# Patient Record
Sex: Female | Born: 1995 | Race: Black or African American | Hispanic: No | Marital: Single
Health system: Midwestern US, Community
[De-identification: ages and names within clinical notes are randomized; demographics above are authoritative.]

## PROBLEM LIST (undated history)

## (undated) DIAGNOSIS — Z8742 Personal history of other diseases of the female genital tract: Secondary | ICD-10-CM

## (undated) DIAGNOSIS — D571 Sickle-cell disease without crisis: Secondary | ICD-10-CM

## (undated) DIAGNOSIS — E039 Hypothyroidism, unspecified: Secondary | ICD-10-CM

## (undated) DIAGNOSIS — Z9289 Personal history of other medical treatment: Secondary | ICD-10-CM

## (undated) DIAGNOSIS — E079 Disorder of thyroid, unspecified: Secondary | ICD-10-CM

## (undated) DIAGNOSIS — D573 Sickle-cell trait: Secondary | ICD-10-CM

## (undated) HISTORY — PX: NO PAST SURGERIES: SHX2092

## (undated) HISTORY — DX: Sickle-cell disease without crisis: D57.1

## (undated) HISTORY — PX: DILATION AND CURETTAGE OF UTERUS: SHX78

---

## 2016-10-18 ENCOUNTER — Inpatient Hospital Stay
Admit: 2016-10-18 | Discharge: 2016-10-19 | Disposition: A | Discharge status: Home / Self Care | Payer: MEDICAID | Attending: Emergency Medicine

## 2016-10-18 DIAGNOSIS — Z760 Encounter for issue of repeat prescription: Secondary | ICD-10-CM

## 2016-10-18 LAB — HCG URINE, QL: HCG urine, QL: POSITIVE — AB

## 2016-10-18 LAB — URINALYSIS W/ RFLX MICROSCOPIC
Bilirubin: NEGATIVE
Blood: NEGATIVE
Glucose: NEGATIVE mg/dL
Leukocyte Esterase: NEGATIVE
Nitrites: NEGATIVE
Protein: NEGATIVE mg/dL
Specific gravity: 1.029 (ref 1.005–1.030)
Urobilinogen: 1 EU/dL (ref 0.2–1.0)
pH (UA): 6.5 (ref 5.0–8.0)

## 2016-10-18 NOTE — ED Triage Notes (Signed)
PT states she was spotting, took at home pregnancy test and it was positive. LMP 09/12/16. Patient states shes also out of her thyroid medication and needs a refill

## 2016-10-18 NOTE — ED Provider Notes (Signed)
EMERGENCY DEPARTMENT HISTORY AND PHYSICAL EXAM    7:28 PM      Date: 10/18/2016  Patient Name: Christine Lam    History of Presenting Illness     Chief Complaint   Patient presents with   ??? Pelvic Pain   ??? Medication Refill         History Provided By: Patient     Chief Complaint: vaginal bleeding   Duration:  1 day   Timing: acute   Location: GU  Quality: spotting   Severity: not reported   Modifying Factors: has not taken anything for it   Associated Symptoms: pelvic pain. No fevers, chills, dizziness, nausea, vomiting.       Additional History (Context): Christine Lam is a 21 y.o. female presenting to the ED for the evaluation of pos home pregnancy test. She then took an at home pregnancy test that was positive. Patient's last menstrual period was 09/12/2016. She reports associated pelvic pain but denies this at present No bleeding at present.. No fevers, chills, dizziness, nausea, vomiting. Patient also explains that she needs a refill of her thyroid medication.Denies CP, palpitations. Has apt with pcp next month but trying to be seen next week. No known drug allergies. No other complaints or concerns at this time.     PCP: None        Past History     Past Medical History:  Past Medical History:   Diagnosis Date   ??? Hypothyroid        Past Surgical History:  History reviewed. No pertinent surgical history.    Family History:  History reviewed. No pertinent family history.    Social History:  Social History   Substance Use Topics   ??? Smoking status: Never Smoker   ??? Smokeless tobacco: Never Used   ??? Alcohol use No       Allergies:  No Known Allergies      Review of Systems       Review of Systems   Constitutional: Negative for chills and fever.   Gastrointestinal: Negative for nausea and vomiting.   Genitourinary: Positive for menstrual problem, pelvic pain and vaginal bleeding (spotting).   Neurological: Negative for dizziness.   All other systems reviewed and are negative.        Physical Exam      Visit Vitals   ??? BP 129/80 (BP Patient Position: At rest)   ??? Pulse 78   ??? Temp 98.5 ??F (36.9 ??C)   ??? Resp 16   ??? Ht  (1.803 m)   ??? Wt 72.6 kg (160 lb)   ??? SpO2 100%   ??? BMI 22.32 kg/m2         Physical Exam   Constitutional: She is oriented to person, place, and time. She appears well-developed and well-nourished. No distress.   NAD, well hydrated, non toxic     HENT:   Head: Normocephalic and atraumatic.   Nose: Nose normal.   Mouth/Throat: Oropharynx is clear and moist. No oropharyngeal exudate.   Eyes: Conjunctivae and EOM are normal. Pupils are equal, round, and reactive to light.   Neck: Normal range of motion. Neck supple.   Cardiovascular: Normal rate, regular rhythm and normal heart sounds.    No murmur heard.  Pulmonary/Chest: Effort normal and breath sounds normal. No respiratory distress. She has no wheezes. She has no rales.   Abdominal: Soft. She exhibits no distension. There is no tenderness. There is no guarding.   Musculoskeletal:  Normal range of motion.   Lymphadenopathy:     She has no cervical adenopathy.   Neurological: She is alert and oriented to person, place, and time. No cranial nerve deficit. Coordination normal.   Skin: Skin is warm. No rash noted. She is not diaphoretic.   Psychiatric: She has a normal mood and affect. Her behavior is normal.   Nursing note and vitals reviewed.        Diagnostic Study Results     Labs -  Recent Results (from the past 12 hour(s))   URINALYSIS W/ RFLX MICROSCOPIC    Collection Time: 10/18/16  7:24 PM   Result Value Ref Range    Color YELLOW      Appearance CLEAR      Specific gravity 1.029 1.005 - 1.030      pH (UA) 6.5 5.0 - 8.0      Protein NEGATIVE  NEG mg/dL    Glucose NEGATIVE  NEG mg/dL    Ketone TRACE (A) NEG mg/dL    Bilirubin NEGATIVE  NEG      Blood NEGATIVE  NEG      Urobilinogen 1.0 0.2 - 1.0 EU/dL    Nitrites NEGATIVE  NEG      Leukocyte Esterase NEGATIVE  NEG     HCG URINE, QL    Collection Time: 10/18/16  7:24 PM    Result Value Ref Range    HCG urine, QL POSITIVE (A) NEG         Radiologic Studies -   No orders to display         Medical Decision Making   I am the first provider for this patient.    I reviewed the vital signs, available nursing notes, past medical history, past surgical history, family history and social history.    Vital Signs-Reviewed the patient's vital signs.    Pulse Oximetry Analysis -  100% on RA (Interpretation) wnl    Records Reviewed: Nursing Notes (Time of Review: 7:28 PM)    ED Course: Progress Notes, Reevaluation, and Consults:      Provider Notes (Medical Decision Making):   Pt here with request for pregnancy test. States she took home one and it was positive. Has mild pelvic cramping but denies this at present as well as no bleeding despite nursing notes. No indication for pelvic. No discharge, or bleeding. States all she wanted was test. Also needing synthroid refill. Will f/u with her pcp next week. No further sx or work up needed.       Diagnosis     Clinical Impression:   1. Medication refill    2. Positive pregnancy test        Disposition: Discharge to home     Follow-up Information     Follow up With Details Comments Contact Info    Gregary Signs, DO Schedule an appointment as soon as possible for a visit  [address]  [address]  [place] [address]  [phone]      [facility] EMERGENCY DEPT   [address]  [place] [address]  [phone]           Patient's Medications   Start Taking    LEVOTHYROXINE (SYNTHROID) 50 MCG TABLET    Take 1 Tab by mouth Daily (before breakfast) for 30 days.    PRENATAL MULTIVIT-CA-MIN-FE-FA (PRENATAL VITAMIN) TAB    Take 1 Tab by mouth daily.   Continue Taking    No medications on file  These Medications have changed    No medications on file   Stop Taking    No medications on file     _______________________________    Attestations:  Scribe AttestaWandra Scotld Baldomero acting as a scribe for and in the presence of Garth Schlatter       October 18, 2016 at 7:28 PM       Provider Attestation:      I personally performed the services described in the documentation, reviewed the documentation, as recorded by the scribe in my presence, and it accurately and completely records my words and actions. October 18, 2016 at 7:28 PM - Earl Gala, PA-C  _______________________________

## 2016-10-18 NOTE — ED Notes (Signed)
I have reviewed discharge instructions with the patient.  The patient verbalized understanding.  Patient armband removed and shredded  Pt VSS, NAD, pain reassessed

## 2016-10-19 ENCOUNTER — Inpatient Hospital Stay: Admit: 2016-10-19 | Discharge: 2016-10-19 | Payer: MEDICAID | Attending: Emergency Medicine

## 2016-10-19 ENCOUNTER — Ambulatory Visit: Primary: Maternal & Fetal Medicine

## 2016-10-19 MED ORDER — PRENATAL VITAMIN,CALCIUM,MINERALS-IRON-FOLIC ACID TABLET
ORAL_TABLET | Freq: Every day | ORAL | 0 refills | Status: DC
Start: 2016-10-19 — End: 2017-01-19

## 2016-10-19 MED ORDER — LEVOTHYROXINE 50 MCG TAB
50 mcg | ORAL_TABLET | Freq: Every day | ORAL | 0 refills | Status: AC
Start: 2016-10-19 — End: 2016-11-17
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
## 2017-01-19 ENCOUNTER — Inpatient Hospital Stay
Admit: 2017-01-19 | Discharge: 2017-01-20 | Disposition: A | Discharge status: Home / Self Care | Payer: MEDICAID | Attending: Emergency Medicine

## 2017-01-19 DIAGNOSIS — O98812 Other maternal infectious and parasitic diseases complicating pregnancy, second trimester: Secondary | ICD-10-CM

## 2017-01-19 LAB — URINALYSIS W/ RFLX MICROSCOPIC
Bilirubin: NEGATIVE
Blood: NEGATIVE
Glucose: NEGATIVE mg/dL
Ketone: NEGATIVE mg/dL
Nitrites: NEGATIVE
Protein: NEGATIVE mg/dL
Specific gravity: 1.02 (ref 1.005–1.030)
Urobilinogen: 0.2 EU/dL (ref 0.2–1.0)
pH (UA): 7.5 (ref 5.0–8.0)

## 2017-01-19 LAB — URINE MICROSCOPIC ONLY
Bacteria: NEGATIVE /hpf
RBC: 0 /hpf (ref 0–5)
WBC: 2 /hpf (ref 0–4)

## 2017-01-19 LAB — WET PREP: Wet prep: NONE SEEN

## 2017-01-19 NOTE — ED Provider Notes (Signed)
HPI     22 y/o, [redacted] week pregnant, female with h/o intermittent mild vaginal discharge and discomfort x 1 week. The pt notes she doesn't have her next Ob/gyn f/u appointment until 02/04/2017. The pt denies fever, chills, abdominal pain, CP, SOB, and any other associated sxs.       Past Medical History:   Diagnosis Date   ??? Hypothyroid        No past surgical history on file.      No family history on file.    Social History     Socioeconomic History   ??? Marital status: SINGLE     Spouse name: Not on file   ??? Number of children: Not on file   ??? Years of education: Not on file   ??? Highest education level: Not on file   Social Needs   ??? Financial resource strain: Not on file   ??? Food insecurity - worry: Not on file   ??? Food insecurity - inability: Not on file   ??? Transportation needs - medical: Not on file   ??? Transportation needs - non-medical: Not on file   Occupational History   ??? Not on file   Tobacco Use   ??? Smoking status: Never Smoker   ??? Smokeless tobacco: Never Used   Substance and Sexual Activity   ??? Alcohol use: No   ??? Drug use: No   ??? Sexual activity: Not on file   Other Topics Concern   ??? Not on file   Social History Narrative   ??? Not on file         ALLERGIES: Patient has no known allergies.    Review of Systems   Constitutional: Negative for chills and fever.   Respiratory: Negative for shortness of breath.    Cardiovascular: Negative for chest pain.   Gastrointestinal: Negative for abdominal pain.   Genitourinary: Positive for vaginal discharge and vaginal pain.   All other systems reviewed and are negative.      There were no vitals filed for this visit.         Physical Exam   Constitutional: She appears well-developed and well-nourished. No distress.   HENT:   Head: Normocephalic and atraumatic.   Nose: Nose normal.   Eyes: EOM are normal. Right eye exhibits no discharge. Left eye exhibits no discharge. No scleral icterus.   Neck: Normal range of motion. Neck supple. No tracheal deviation present.    Cardiovascular: Normal rate, regular rhythm and intact distal pulses.   Pulmonary/Chest: Effort normal and breath sounds normal.   Abdominal: Soft. She exhibits no distension.   Genitourinary:   Genitourinary Comments: See pelvic procedure note,.     Musculoskeletal: She exhibits no deformity.   Neurological: She is alert. Coordination normal.   Skin: Skin is warm and dry. NE.   Psychiatric: She has a normal mood and affect. Her behavior is normal.   Nursing note and vitals reviewed.       MDM       Pelvic Exam  Date/Time: 01/19/2017 6:29 PM  Performed by: Eusebio Me NP-C.  Procedure duration (minutes): 2.  Documented by: Eusebio Me NP-C.   Chaperone: Passenger transport manager.  Type of exam performed: speculum.    External genitalia appearance: normal.    Vaginal exam:  discharge.    The amount of discharge was:  mild.  The discharge was white.   Cervical exam:  normal.    Specimen(s) collected:  chlamydia, GC and vaginal  culture.          Scribe Attestation     Luanna SalkBrian E Ochoa acting as a Neurosurgeonscribe for and in the presence of Katherine Roanhur, Kiron Osmun, NP-C      January 19, 2017 at 5:30 PM       Provider Attestation:      I personally performed the services described in the documentation, reviewed the documentation, as recorded by the scribe in my presence, and it accurately and completely records my words and actions. January 19, 2017 at 5:30 PM - Katherine Roanhur, Gopal Malter, NP-C    Diagnosis:   1. BV (bacterial vaginosis)    2. Vaginal candidiasis          Disposition:   Discharged to Home      Follow-up Information     Follow up With Specialties Details Why Contact Info    Read DriversParson, Angela W, MD Obstetrics & Gynecology, Gynecology, Obstetrics Call to arrange follow up  [address]  [address]  [facility]  [place] [id number]  [phone]               Medication List      START taking these medications    metroNIDAZOLE 500 mg tablet  Commonly known as:  FLAGYL  Take 1 Tab by mouth two (2) times a day for 7 days.     miconazole 2 % vaginal cream   Commonly known as:  MONISTAT 7  One applicator QHS vaginally for seven days.        CONTINUE taking these medications    prenatal multivit-ca-min-fe-fa Tab  Commonly known as:  PRENATAL VITAMIN  Take 1 Tab by mouth daily.        ASK your doctor about these medications    levothyroxine 75 mcg tablet  Commonly known as:  SYNTHROID           Where to Get Your Medications      Information about where to get these medications is not yet available    Ask your nurse or doctor about these medications  ?? metroNIDAZOLE 500 mg tablet  ?? miconazole 2 % vaginal cream  ?? prenatal multivit-ca-min-fe-fa Tab

## 2017-01-19 NOTE — ED Triage Notes (Signed)
"  I'm [redacted] weeks pregnant but I'm having vaginal discharge and itching."

## 2017-01-19 NOTE — ED Notes (Signed)
Discharge medications reviewed with patient and appropriate educational materials and side effects teaching were provided. I have reviewed discharge instructions with the patient.  The patient verbalized understanding.

## 2017-01-20 MED ORDER — PRENATAL VITAMIN,CALCIUM,MINERALS-IRON-FOLIC ACID TABLET
ORAL_TABLET | Freq: Every day | ORAL | 0 refills | Status: AC
Start: 2017-01-20 — End: ?

## 2017-01-20 MED ORDER — MICONAZOLE NITRATE 2 % VAGINAL CREAM
2 % | VAGINAL | 0 refills | Status: AC
Start: 2017-01-20 — End: ?

## 2017-01-20 MED ORDER — METRONIDAZOLE 500 MG TAB
500 mg | ORAL_TABLET | Freq: Two times a day (BID) | ORAL | 0 refills | Status: AC
Start: 2017-01-20 — End: 2017-01-26

## 2017-01-20 MED ORDER — FLUCONAZOLE 100 MG TAB
100 mg | ORAL | Status: DC
Start: 2017-01-20 — End: 2017-01-19

## 2017-01-21 LAB — CHLAMYDIA/NEISSERIA AMPLIFICATION
Chlamydia amplification: NEGATIVE
N. gonorrhoeae amplification: NEGATIVE

## 2017-04-06 ENCOUNTER — Inpatient Hospital Stay
Admit: 2017-04-06 | Discharge: 2017-04-06 | Disposition: A | Discharge status: Home / Self Care | Payer: MEDICAID | Attending: Emergency Medicine

## 2017-04-06 DIAGNOSIS — R238 Other skin changes: Secondary | ICD-10-CM

## 2017-04-06 MED ORDER — ACETAMINOPHEN 325 MG TABLET
325 mg | ORAL_TABLET | Freq: Four times a day (QID) | ORAL | 0 refills | Status: AC | PRN
Start: 2017-04-06 — End: ?

## 2017-04-06 MED ORDER — ACETAMINOPHEN 325 MG TABLET
325 mg | ORAL | Status: AC
Start: 2017-04-06 — End: 2017-04-06
  Administered 2017-04-06: 17:00:00 via ORAL

## 2017-04-06 MED FILL — ACETAMINOPHEN 325 MG TABLET: 325 mg | ORAL | Qty: 2

## 2017-04-06 NOTE — ED Provider Notes (Signed)
EMERGENCY DEPARTMENT HISTORY AND PHYSICAL EXAM    1:29 PM      Date: 04/06/2017  Patient Name: Christine Lam    History of Presenting Illness     Chief Complaint   Patient presents with   ??? Skin Problem         History Provided By: Patient      Additional History (Context): Christine Lam is a 22 y.o. female with hx of hypothyroidism who presents with complaint of rash below eyebrows for 1 week.  She notes the rash started after getting her eyebrows waxed.  Has not taken any medicine for pain.  Denies fever, chills, drainage.  Patient notes she is [redacted] weeks pregnant.  Denies abdominal pain, vaginal bleeding, back pain, nausea, vomiting, dysuria.    PCP: None    Current Outpatient Medications   Medication Sig Dispense Refill   ??? acetaminophen (TYLENOL) 325 mg tablet Take 2 Tabs by mouth every six (6) hours as needed for Pain. 20 Tab 0   ??? levothyroxine (SYNTHROID) 75 mcg tablet   0   ??? prenatal multivit-ca-min-fe-fa (PRENATAL VITAMIN) tab Take 1 Tab by mouth daily. 30 Tab 0   ??? miconazole (MONISTAT 7) 2 % vaginal cream One applicator QHS vaginally for seven days. 45 g 0       Past History     Past Medical History:  Past Medical History:   Diagnosis Date   ??? Hypothyroid        Past Surgical History:  No past surgical history on file.    Family History:  No family history on file.    Social History:  Social History     Tobacco Use   ??? Smoking status: Never Smoker   ??? Smokeless tobacco: Never Used   Substance Use Topics   ??? Alcohol use: No   ??? Drug use: No       Allergies:  No Known Allergies      Review of Systems       Review of Systems   Constitutional: Negative for chills and fever.   Respiratory: Negative for shortness of breath.    Cardiovascular: Negative for chest pain.   Gastrointestinal: Negative for abdominal pain, nausea and vomiting.   Skin: Positive for rash.   Neurological: Negative for weakness.   All other systems reviewed and are negative.        Physical Exam     Visit Vitals   BP 133/79 (BP 1 Location: Right arm, BP Patient Position: At rest)   Pulse 86   Temp 98.1 ??F (36.7 ??C)   Resp 16   SpO2 99%         Physical Exam   Constitutional: She appears well-developed and well-nourished. No distress.   HENT:   Head: Normocephalic and atraumatic.       Neck: Normal range of motion. Neck supple.   Cardiovascular: Normal rate, regular rhythm, normal heart sounds and intact distal pulses. Exam reveals no gallop and no friction rub.   No murmur heard.  Pulmonary/Chest: Effort normal and breath sounds normal. No respiratory distress. She has no wheezes. She has no rales.   Abdominal: Soft. She exhibits no distension. There is no tenderness.   Gravid to umbilicus   Musculoskeletal: Normal range of motion.   Neurological: She is alert.   Skin: Skin is warm. No rash noted. She is not diaphoretic.   Nursing note and vitals reviewed.        Diagnostic Study Results  Labs -  No results found for this or any previous visit (from the past 12 hour(s)).    Radiologic Studies -   No orders to display         Medical Decision Making   I am the first provider for this patient.    I reviewed the vital signs, available nursing notes, past medical history, past surgical history, family history and social history.    Vital Signs-Reviewed the patient's vital signs.  Records Reviewed: Nursing Notes and Old Medical Records (Time of Review: 1:29 PM)    ED Course: Progress Notes, Reevaluation, and Consults:  1:29 PM  Reviewed plan with patient. Discussed need for close outpatient follow-up. Discussed strict return precautions, including fever, drainage, or any other medical concerns.    Provider Notes (Medical Decision Making): 22 year old who presents due to rash below eyebrows after waxing.  No sign of infection.  Stable for discharge with topical Neosporin and Tylenol for pain.      Diagnosis     Clinical Impression:   1. Skin irritation      Disposition: home     Follow-up Information      Follow up With Specialties Details Why Contact Info    [facility] EMERGENCY DEPT Emergency Medicine  If symptoms worsen [address]  [place] [address]  [phone]    [facility]  Schedule an appointment as soon as possible for a visit  [address]  [place] [address]  [phone]           Patient's Medications   Start Taking    ACETAMINOPHEN (TYLENOL) 325 MG TABLET    Take 2 Tabs by mouth every six (6) hours as needed for Pain.   Continue Taking    LEVOTHYROXINE (SYNTHROID) 75 MCG TABLET        MICONAZOLE (MONISTAT 7) 2 % VAGINAL CREAM    One applicator QHS vaginally for seven days.    PRENATAL MULTIVIT-CA-MIN-FE-FA (PRENATAL VITAMIN) TAB    Take 1 Tab by mouth daily.   These Medications have changed    No medications on file   Stop Taking    No medications on file

## 2017-04-06 NOTE — ED Notes (Signed)
I have reviewed discharge instructions with the patient.  The patient verbalized understanding.  Pt walked to ed lobby in no distress and agreeable to terms of discharge.

## 2017-04-06 NOTE — ED Triage Notes (Signed)
Pt arrived through triage with c/o bilateral pain underneath eyebrow s/p having eyebrows waxed.

## 2017-04-16 ENCOUNTER — Encounter: Attending: Gerontology | Primary: Maternal & Fetal Medicine

## 2017-04-28 ENCOUNTER — Inpatient Hospital Stay
Admit: 2017-04-28 | Discharge: 2017-04-28 | Disposition: A | Discharge status: Home / Self Care | Payer: MEDICAID | Attending: Emergency Medicine

## 2017-04-28 DIAGNOSIS — O2343 Unspecified infection of urinary tract in pregnancy, third trimester: Secondary | ICD-10-CM

## 2017-04-28 LAB — CBC WITH AUTOMATED DIFF
ABS. BASOPHILS: 0 10*3/uL (ref 0.0–0.1)
ABS. EOSINOPHILS: 0.3 10*3/uL (ref 0.0–0.4)
ABS. LYMPHOCYTES: 1.4 10*3/uL (ref 0.9–3.6)
ABS. MONOCYTES: 1.1 10*3/uL (ref 0.05–1.2)
ABS. NEUTROPHILS: 6 10*3/uL (ref 1.8–8.0)
BASOPHILS: 0 % (ref 0–2)
EOSINOPHILS: 3 % (ref 0–5)
HCT: 31.2 % — ABNORMAL LOW (ref 35.0–45.0)
HGB: 10.3 g/dL — ABNORMAL LOW (ref 12.0–16.0)
LYMPHOCYTES: 16 % — ABNORMAL LOW (ref 21–52)
MCH: 29.3 PG (ref 24.0–34.0)
MCHC: 33 g/dL (ref 31.0–37.0)
MCV: 88.6 FL (ref 74.0–97.0)
MONOCYTES: 12 % — ABNORMAL HIGH (ref 3–10)
MPV: 9.4 FL (ref 9.2–11.8)
NEUTROPHILS: 69 % (ref 40–73)
PLATELET: 203 10*3/uL (ref 135–420)
RBC: 3.52 M/uL — ABNORMAL LOW (ref 4.20–5.30)
RDW: 13.1 % (ref 11.6–14.5)
WBC: 8.7 10*3/uL (ref 4.6–13.2)

## 2017-04-28 LAB — URINALYSIS W/ RFLX MICROSCOPIC
Bilirubin: NEGATIVE
Blood: NEGATIVE
Glucose: NEGATIVE mg/dL
Ketone: NEGATIVE mg/dL
Nitrites: NEGATIVE
Protein: NEGATIVE mg/dL
Specific gravity: 1.016 (ref 1.005–1.030)
Urobilinogen: 0.2 EU/dL (ref 0.2–1.0)
pH (UA): 7 (ref 5.0–8.0)

## 2017-04-28 LAB — URINE MICROSCOPIC ONLY
RBC: NEGATIVE /hpf (ref 0–5)
WBC: 5 /hpf (ref 0–4)

## 2017-04-28 LAB — METABOLIC PANEL, COMPREHENSIVE
A-G Ratio: 0.7 — ABNORMAL LOW (ref 0.8–1.7)
ALT (SGPT): 23 U/L (ref 13–56)
AST (SGOT): 23 U/L (ref 15–37)
Albumin: 2.9 g/dL — ABNORMAL LOW (ref 3.4–5.0)
Alk. phosphatase: 71 U/L (ref 45–117)
Anion gap: 4 mmol/L (ref 3.0–18)
BUN/Creatinine ratio: 7 — ABNORMAL LOW (ref 12–20)
BUN: 4 MG/DL — ABNORMAL LOW (ref 7.0–18)
Bilirubin, total: 0.3 MG/DL (ref 0.2–1.0)
CO2: 28 mmol/L (ref 21–32)
Calcium: 8.5 MG/DL (ref 8.5–10.1)
Chloride: 105 mmol/L (ref 100–108)
Creatinine: 0.6 MG/DL (ref 0.6–1.3)
GFR est AA: >60 mL/min/{1.73_m2} (ref >60)
GFR est non-AA: >60 mL/min/{1.73_m2} (ref >60)
Globulin: 4.3 g/dL — ABNORMAL HIGH (ref 2.0–4.0)
Glucose: 69 mg/dL — ABNORMAL LOW (ref 74–99)
Potassium: 3.9 mmol/L (ref 3.5–5.5)
Protein, total: 7.2 g/dL (ref 6.4–8.2)
Sodium: 137 mmol/L (ref 136–145)

## 2017-04-28 LAB — POC CHEM8
Anion gap, POC: 15 (ref 10–20)
BUN, POC: <3 MG/DL — ABNORMAL LOW (ref 7–18)
CO2, POC: 25 MMOL/L — ABNORMAL HIGH (ref 19–24)
Calcium, ionized (POC): 1.19 mmol/L (ref 1.12–1.32)
Chloride, POC: 103 MMOL/L (ref 100–108)
Creatinine, POC: 0.6 MG/DL (ref 0.6–1.3)
GFRAA, POC: >60 mL/min/{1.73_m2} (ref >60)
GFRNA, POC: >60 mL/min/{1.73_m2} (ref >60)
Glucose, POC: 74 MG/DL (ref 74–106)
Hematocrit, POC: 29 % — ABNORMAL LOW (ref 36–49)
Hemoglobin, POC: 9.9 G/DL — ABNORMAL LOW (ref 12–16)
Potassium, POC: 3.9 MMOL/L (ref 3.5–5.5)
Sodium, POC: 138 MMOL/L (ref 136–145)

## 2017-04-28 MED ORDER — CEFTRIAXONE 1 GRAM SOLUTION FOR INJECTION
1 gram | INTRAMUSCULAR | Status: AC
Start: 2017-04-28 — End: 2017-04-28
  Administered 2017-04-28: 23:00:00 via INTRAVENOUS

## 2017-04-28 MED ORDER — MAGNESIUM SULFATE 2 GRAM/50 ML IVPB
2 gram/50 mL (4 %) | Freq: Once | INTRAVENOUS | Status: AC
Start: 2017-04-28 — End: 2017-04-28
  Administered 2017-04-28: 22:00:00 via INTRAVENOUS

## 2017-04-28 MED ORDER — ONDANSETRON (PF) 4 MG/2 ML INJECTION
4 mg/2 mL | INTRAMUSCULAR | Status: AC
Start: 2017-04-28 — End: 2017-04-28
  Administered 2017-04-28: 21:00:00 via INTRAVENOUS

## 2017-04-28 MED ORDER — CEFTRIAXONE 1 GRAM SOLUTION FOR INJECTION
1 gram | INTRAMUSCULAR | Status: DC
Start: 2017-04-28 — End: 2017-04-28

## 2017-04-28 MED ORDER — ACETAMINOPHEN 500 MG TAB
500 mg | ORAL | Status: DC
Start: 2017-04-28 — End: 2017-04-28

## 2017-04-28 MED ORDER — CEPHALEXIN 500 MG CAP
500 mg | ORAL_CAPSULE | Freq: Four times a day (QID) | ORAL | 0 refills | Status: AC
Start: 2017-04-28 — End: 2017-05-03

## 2017-04-28 MED ORDER — SODIUM CHLORIDE 0.9% BOLUS IV
0.9 % | Freq: Once | INTRAVENOUS | Status: AC
Start: 2017-04-28 — End: 2017-04-28
  Administered 2017-04-28: 21:00:00 via INTRAVENOUS

## 2017-04-28 MED FILL — ONDANSETRON (PF) 4 MG/2 ML INJECTION: 4 mg/2 mL | INTRAMUSCULAR | Qty: 2

## 2017-04-28 MED FILL — CEFTRIAXONE 1 GRAM SOLUTION FOR INJECTION: 1 gram | INTRAMUSCULAR | Qty: 1

## 2017-04-28 MED FILL — SODIUM CHLORIDE 0.9 % IV: INTRAVENOUS | Qty: 1000

## 2017-04-28 MED FILL — MAGNESIUM SULFATE 2 GRAM/50 ML IVPB: 2 gram/50 mL (4 %) | INTRAVENOUS | Qty: 50

## 2017-04-28 NOTE — ED Notes (Signed)
Pt denies vaginal bleeding/discharge

## 2017-04-28 NOTE — ED Provider Notes (Signed)
EMERGENCY DEPARTMENT HISTORY AND PHYSICAL EXAM    5:17 PM      Date: 04/28/2017  Patient Name: Christine Lam    History of Presenting Illness     Chief Complaint   Patient presents with   ??? Nausea   ??? Headache         History Provided By: Patient      Additional History (Context): Christine Lam is a 22 y.o. female who presents with 2 episodes of nausea vomiting with diarrhea.  Patient stated this started today she denies any pelvic pain she denies any cramping contractions or vaginal bleeding.  Patient is a 32-week OB Prima gravida     PCP: Too, Peter Congo, MD      Current Facility-Administered Medications   Medication Dose Route Frequency Provider Last Rate Last Dose   ??? acetaminophen (TYLENOL) tablet 1,000 mg  1,000 mg Oral NOW Hilbert Corrigan, MD       ??? magnesium sulfate 2 g/50 ml IVPB (premix or compounded)  2 g IntraVENous ONCE Hilbert Corrigan, MD 50 mL/hr at 04/28/17 1818 2 g at 04/28/17 1818   ??? cefTRIAXone (ROCEPHIN) injection 1 g  1 g IntraVENous NOW Hilbert Corrigan, MD         Current Outpatient Medications   Medication Sig Dispense Refill   ??? cephALEXin (KEFLEX) 500 mg capsule Take 1 Cap by mouth four (4) times daily for 5 days. 20 Cap 0   ??? acetaminophen (TYLENOL) 325 mg tablet Take 2 Tabs by mouth every six (6) hours as needed for Pain. 20 Tab 0   ??? levothyroxine (SYNTHROID) 75 mcg tablet   0   ??? prenatal multivit-ca-min-fe-fa (PRENATAL VITAMIN) tab Take 1 Tab by mouth daily. 30 Tab 0   ??? miconazole (MONISTAT 7) 2 % vaginal cream One applicator QHS vaginally for seven days. 45 g 0       Past History     Past Medical History:  Past Medical History:   Diagnosis Date   ??? Hypothyroid        Past Surgical History:  History reviewed. No pertinent surgical history.    Family History:  History reviewed. No pertinent family history.    Social History:  Social History     Tobacco Use   ??? Smoking status: Never Smoker   ??? Smokeless tobacco: Never Used   Substance Use Topics   ??? Alcohol use: No   ??? Drug use: No        Allergies:  No Known Allergies      Review of Systems       Review of Systems   Constitutional: Negative for chills, diaphoresis and fever.   HENT: Negative for congestion.    Eyes: Negative for visual disturbance.   Respiratory: Negative for cough, chest tightness and shortness of breath.    Cardiovascular: Negative for chest pain.   Gastrointestinal: Positive for diarrhea, nausea and vomiting. Negative for abdominal pain.   Genitourinary: Negative for decreased urine volume and urgency.   Musculoskeletal: Negative for back pain.   Skin: Negative for rash.   Neurological: Negative for dizziness, syncope and weakness.   All other systems reviewed and are negative.        Physical Exam     Visit Vitals  BP 125/67 (BP 1 Location: Left arm, BP Patient Position: At rest)   Pulse 90   Temp 98 ??F (36.7 ??C)   Resp 13   SpO2 100%  Physical Exam   Constitutional: She is oriented to person, place, and time. She appears well-developed and well-nourished. No distress.   HENT:   Head: Normocephalic and atraumatic.   Mouth/Throat: Oropharynx is clear and moist.   Eyes: Pupils are equal, round, and reactive to light. Conjunctivae and EOM are normal. No scleral icterus.   Neck: Normal range of motion. Neck supple.   Cardiovascular: Normal rate, regular rhythm and normal heart sounds.   No murmur heard.  Pulmonary/Chest: Effort normal and breath sounds normal. No respiratory distress.   Abdominal: Soft. Bowel sounds are normal. She exhibits no distension. There is no tenderness.   No abdominal tenderness no CVA tenderness abdomen gravid with the uterus approximately 4 fingerbreadths below the xiphoid process   Musculoskeletal: She exhibits no edema.   Lymphadenopathy:     She has no cervical adenopathy.   Neurological: She is alert and oriented to person, place, and time. Coordination normal.   Skin: Skin is warm and dry. No rash noted.   Psychiatric: She has a normal mood and affect. Her behavior is normal.    Nursing note and vitals reviewed.        Diagnostic Study Results     Labs -  Recent Results (from the past 12 hour(s))   METABOLIC PANEL, COMPREHENSIVE    Collection Time: 04/28/17  5:15 PM   Result Value Ref Range    Sodium 137 136 - 145 mmol/L    Potassium 3.9 3.5 - 5.5 mmol/L    Chloride 105 100 - 108 mmol/L    CO2 28 21 - 32 mmol/L    Anion gap 4 3.0 - 18 mmol/L    Glucose 69 (L) 74 - 99 mg/dL    BUN 4 (L) 7.0 - 18 MG/DL    Creatinine 0.60 0.6 - 1.3 MG/DL    BUN/Creatinine ratio 7 (L) 12 - 20      GFR est AA >60 >60 ml/min/1.6m    GFR est non-AA >60 >60 ml/min/1.727m   Calcium 8.5 8.5 - 10.1 MG/DL    Bilirubin, total 0.3 0.2 - 1.0 MG/DL    ALT (SGPT) 23 13 - 56 U/L    AST (SGOT) 23 15 - 37 U/L    Alk. phosphatase 71 45 - 117 U/L    Protein, total 7.2 6.4 - 8.2 g/dL    Albumin 2.9 (L) 3.4 - 5.0 g/dL    Globulin 4.3 (H) 2.0 - 4.0 g/dL    A-G Ratio 0.7 (L) 0.8 - 1.7     CBC WITH AUTOMATED DIFF    Collection Time: 04/28/17  5:15 PM   Result Value Ref Range    WBC 8.7 4.6 - 13.2 K/uL    RBC 3.52 (L) 4.20 - 5.30 M/uL    HGB 10.3 (L) 12.0 - 16.0 g/dL    HCT 31.2 (L) 35.0 - 45.0 %    MCV 88.6 74.0 - 97.0 FL    MCH 29.3 24.0 - 34.0 PG    MCHC 33.0 31.0 - 37.0 g/dL    RDW 13.1 11.6 - 14.5 %    PLATELET 203 135 - 420 K/uL    MPV 9.4 9.2 - 11.8 FL    NEUTROPHILS 69 40 - 73 %    LYMPHOCYTES 16 (L) 21 - 52 %    MONOCYTES 12 (H) 3 - 10 %    EOSINOPHILS 3 0 - 5 %    BASOPHILS 0 0 - 2 %    ABS.  NEUTROPHILS 6.0 1.8 - 8.0 K/UL    ABS. LYMPHOCYTES 1.4 0.9 - 3.6 K/UL    ABS. MONOCYTES 1.1 0.05 - 1.2 K/UL    ABS. EOSINOPHILS 0.3 0.0 - 0.4 K/UL    ABS. BASOPHILS 0.0 0.0 - 0.1 K/UL    DF AUTOMATED     POC CHEM8    Collection Time: 04/28/17  5:19 PM   Result Value Ref Range    CO2, POC 25 (H) 19 - 24 MMOL/L    Glucose, POC 74 74 - 106 MG/DL    BUN, POC <3 (L) 7 - 18 MG/DL    Creatinine, POC 0.6 0.6 - 1.3 MG/DL    GFRAA, POC >60 >60 ml/min/1.69m    GFRNA, POC >60 >60 ml/min/1.737m   Sodium, POC 138 136 - 145 MMOL/L     Potassium, POC 3.9 3.5 - 5.5 MMOL/L    Calcium, ionized (POC) 1.19 1.12 - 1.32 mmol/L    Chloride, POC 103 100 - 108 MMOL/L    Anion gap, POC 15 10 - 20      Hematocrit, POC 29 (L) 36 - 49 %    Hemoglobin, POC 9.9 (L) 12 - 16 G/DL   URINALYSIS W/ RFLX MICROSCOPIC    Collection Time: 04/28/17  5:45 PM   Result Value Ref Range    Color YELLOW      Appearance CLOUDY      Specific gravity 1.016 1.005 - 1.030      pH (UA) 7.0 5.0 - 8.0      Protein NEGATIVE  NEG mg/dL    Glucose NEGATIVE  NEG mg/dL    Ketone NEGATIVE  NEG mg/dL    Bilirubin NEGATIVE  NEG      Blood NEGATIVE  NEG      Urobilinogen 0.2 0.2 - 1.0 EU/dL    Nitrites NEGATIVE  NEG      Leukocyte Esterase MODERATE (A) NEG     URINE MICROSCOPIC ONLY    Collection Time: 04/28/17  5:45 PM   Result Value Ref Range    WBC 5 to 10 0 - 4 /hpf    RBC NEGATIVE  0 - 5 /hpf    Epithelial cells 4+ 0 - 5 /lpf    Bacteria 4+ (A) NEG /hpf    Mucus FEW (A) NEG /lpf       Radiologic Studies -   No orders to display         Medical Decision Making   I am the first provider for this patient.    I reviewed the vital signs, available nursing notes, past medical history, past surgical history, family history and social history.    Vital Signs-Reviewed the patient's vital signs.      EKG:    Records Reviewed: Nursing Notes and Old Medical Records (Time of Review: 5:17 PM)    ED Course: Progress Notes, Reevaluation, and Consults:      Provider Notes (Medical Decision Making):   MDM  Number of Diagnoses or Management Options  Urinary tract infection in mother during third trimester of pregnancy:   Diagnosis management comments: Prima gravida with nausea vomiting diarrhea pressure stable at present we will give fluids Zofran    Labs and ua reviewed urine cultured rocephin in ED. patient denies any contractions or vaginal bleeding or pelvic pain at present       Amount and/or Complexity of Data Reviewed  Clinical lab tests: ordered and reviewed     Risk of Complications,  Morbidity, and/or Mortality  Presenting problems: high  Diagnostic procedures: moderate  Management options: moderate                Diagnosis     Clinical Impression:   1. Urinary tract infection in mother during third trimester of pregnancy      Disposition: home     Follow-up Information     Follow up With Specialties Details Why [place]    [facility] EMERGENCY DEPT Emergency Medicine  As needed, If symptoms worsen [place]  [phone]    Too, Peter Congo, MD Obstetrics & Gynecology, Gynecology, Obstetrics Schedule an appointment as soon as possible for a visit for ED follow up appointment  [address]  [address]  [place] [address]  [phone]             Patient's Medications   Start Taking    CEPHALEXIN (KEFLEX) 500 MG CAPSULE    Take 1 Cap by mouth four (4) times daily for 5 days.   Continue Taking    ACETAMINOPHEN (TYLENOL) 325 MG TABLET    Take 2 Tabs by mouth every six (6) hours as needed for Pain.    LEVOTHYROXINE (SYNTHROID) 75 MCG TABLET        MICONAZOLE (MONISTAT 7) 2 % VAGINAL CREAM    One applicator QHS vaginally for seven days.    PRENATAL MULTIVIT-CA-MIN-FE-FA (PRENATAL VITAMIN) TAB    Take 1 Tab by mouth daily.   These Medications have changed    No medications on file   Stop Taking    No medications on file     _______________________________    Please note that this dictation was completed with Dragon, the computer voice recognition software.  Quite often unanticipated grammatical, syntax, homophones, and other interpretive errors are inadvertently transcribed by the computer software.  Please disregard these errors.  Please excuse any errors that have escaped final proofreading.

## 2017-04-28 NOTE — ED Triage Notes (Signed)
Pt presents via EMS for N/V, HA x1 week. Pt is 32 weekend ws put on Mg for HA. Has not started it yet BP-125/67

## 2017-04-28 NOTE — ED Notes (Signed)
I have reviewed discharge instructions with the patient.  The patient verbalized understanding.  Patient armband removed and shredded  Pt VSS, NAD

## 2017-04-28 NOTE — ED Notes (Signed)
Pt stating she is still having HA. MD aware. MD encouraged patient to get Mg px filled from OB and to f/u regarding HA since she has had it for over a week.

## 2017-04-28 NOTE — ED Notes (Signed)
Pt helped onto bedside commode. Educated on UTIs

## 2017-05-01 LAB — CULTURE, URINE
Culture result:: 100000
Culture: 100000

## 2017-12-05 ENCOUNTER — Other Ambulatory Visit: Payer: Self-pay

## 2017-12-05 ENCOUNTER — Emergency Department (HOSPITAL_COMMUNITY)
Admission: EM | Admit: 2017-12-05 | Discharge: 2017-12-05 | Disposition: A | Discharge status: Home / Self Care | Payer: Medicaid Other | Attending: Emergency Medicine | Admitting: Emergency Medicine

## 2017-12-05 ENCOUNTER — Encounter (HOSPITAL_COMMUNITY): Payer: Self-pay | Admitting: Emergency Medicine

## 2017-12-05 DIAGNOSIS — B9689 Other specified bacterial agents as the cause of diseases classified elsewhere: Secondary | ICD-10-CM | POA: Diagnosis not present

## 2017-12-05 DIAGNOSIS — R102 Pelvic and perineal pain: Secondary | ICD-10-CM | POA: Diagnosis present

## 2017-12-05 DIAGNOSIS — N760 Acute vaginitis: Secondary | ICD-10-CM | POA: Insufficient documentation

## 2017-12-05 HISTORY — DX: Disorder of thyroid, unspecified: E07.9

## 2017-12-05 LAB — URINALYSIS, ROUTINE W REFLEX MICROSCOPIC
Bacteria, UA: NONE SEEN
Bilirubin Urine: NEGATIVE
Glucose, UA: NEGATIVE mg/dL
Hgb urine dipstick: NEGATIVE
Ketones, ur: NEGATIVE mg/dL
Nitrite: NEGATIVE
Protein, ur: NEGATIVE mg/dL
SPECIFIC GRAVITY, URINE: 1.017 (ref 1.005–1.030)
pH: 6 (ref 5.0–8.0)

## 2017-12-05 LAB — POC URINE PREG, ED: Preg Test, Ur: NEGATIVE

## 2017-12-05 LAB — WET PREP, GENITAL
Sperm: NONE SEEN
Trich, Wet Prep: NONE SEEN
Yeast Wet Prep HPF POC: NONE SEEN

## 2017-12-05 MED ORDER — METRONIDAZOLE 500 MG PO TABS: 500.0000 mg | ORAL_TABLET | Freq: Two times a day (BID) | ORAL | 0 refills | Status: DC

## 2017-12-05 NOTE — ED Provider Notes (Signed)
[facility] EMERGENCY DEPARTMENT Provider Note   CSN: [id number] Arrival date & time: 12/05/17  1129     History   Chief Complaint Vaginal pain  HPI Victoria Miller is a 22 y.o. female with a hx of hypothyroidism who presents to the ED with complaints of vaginal discomfort for the past 2-3 days. Patient states that she is having discomfort at the introitus, it is constant, achy in nature, without specific alleviating/aggravating factors, no intervention trialed PTA. She states this feels similar to prior BV which was improved with abx, she is having her typical vaginal discharge- nothing new/different. No notable odors. She had vaginal intercourse the day prior to onset of pain, but had no acute onset of pain during the intercourse. She is sexually active in a monogamous relationship with 1 female partner, intermittent use of condoms, not currently on birth control but is requesting OBGYN referral to discuss options. She is not overly concerned for STDs.  Denies fever, chills, nausea, vomiting, abdominal pain, fever, chills, dysuria, urgency, or frequency. LMP  HPI  No past medical history on file.  There are no active problems to display for this patient.   History reviewed. No pertinent surgical history.   OB History   None      Home Medications    Prior to Admission medications   Not on File    Family History No family history on file.  Social History Social History   Tobacco Use  . Smoking status: Not on file  Substance Use Topics  . Alcohol use: Not on file  . Drug use: Not on file     Allergies   Patient has no allergy information on record.   Review of Systems Review of Systems  Constitutional: Negative for chills and fever.  Gastrointestinal: Negative for abdominal pain, blood in stool, constipation, diarrhea, nausea and vomiting.  Genitourinary: Positive for vaginal pain. Negative for dysuria, frequency, menstrual problem, pelvic pain,  vaginal bleeding and vaginal discharge.  All other systems reviewed and are negative.    Physical Exam Updated Vital Signs There were no vitals taken for this visit.  Physical Exam  Constitutional: She appears well-developed and well-nourished.  Non-toxic appearance. No distress.  HENT:  Head: Normocephalic and atraumatic.  Eyes: Conjunctivae are normal. Right eye exhibits no discharge. Left eye exhibits no discharge.  Neck: Neck supple.  Cardiovascular: Normal rate and regular rhythm.  Pulmonary/Chest: Effort normal and breath sounds normal. No respiratory distress. She has no wheezes. She has no rhonchi. She has no rales.  Respiration even and unlabored  Abdominal: Soft. She exhibits no distension. There is no tenderness. There is no rigidity, no rebound, no guarding, no tenderness at McBurney's point and negative Murphy's sign.  Genitourinary: Pelvic exam was performed with patient supine. No labial fusion. There is no rash, tenderness, lesion or injury on the right labia. There is no rash, tenderness, lesion or injury on the left labia. Cervix exhibits no motion tenderness and no friability. Right adnexum displays no mass, no tenderness and no fullness. Left adnexum displays no mass, no tenderness and no fullness. No erythema, tenderness or bleeding in the vagina. No foreign body in the vagina. No signs of injury around the vagina. Vaginal discharge (moderate amount of white to light green colored discharge in vaginal vault) found.  Genitourinary Comments: No lesions. No lacerations. No open wounds. No ecchymosis. No palpable fluctuance or gross abscess.  EDT present as chaperone.   Neurological: She is alert.  Clear  speech.   Skin: Skin is warm and dry. No rash noted.  Psychiatric: She has a normal mood and affect. Her behavior is normal.  Nursing note and vitals reviewed.    ED Treatments / Results  Labs (all labs ordered are listed, but only abnormal results are  displayed) Labs Reviewed - No data to display  EKG None  Radiology No results found.  Procedures Procedures (including critical care time)  Medications Ordered in ED Medications - No data to display   Initial Impression / Assessment and Plan / ED Course  I have reviewed the triage vital signs and the nursing notes.  Pertinent labs & imaging results that were available during my care of the patient were reviewed by me and considered in my medical decision making (see chart for details).   Patient presents to the ED with complaint of pain to vaginal introitus. Patient is nontoxic appearing, in no apparent distress, vitals WNL. Physical exam is benign, she does have moderate amount of discharge on pelvic exam. No abdominal/adnexal/cervical motion tenderness. She does not have any obvious injuries or lesions. Urinalysis fairly negative, small leukocytes- no urinary sxs, doubt UTI. GC, chlamydia, HIV, RPR pending, patient with low concern for STD, doubt PID. Wet prep with findings consistent with BV, patient without change in her discharge, however she does report similar discomfort w/ prior BV which improved following flagyl therefore feel is reasonable to treat with course of flagyl. We discussed no EtOH consumption with this medicine. Obgyn information provided for follow up. I discussed results, treatment plan, need for follow-up, and return precautions with the patient. Provided opportunity for questions, patient confirmed understanding and is in agreement with plan.    Final Clinical Impressions(s) / ED Diagnoses   Final diagnoses:  Bacterial vaginosis    ED Discharge Orders         Ordered    metroNIDAZOLE (FLAGYL) 500 MG tablet  2 times daily     12/05/17 [address]1309           Petrucelli, Samantha R, PA-C 12/05/17 1321    Mesner, Barbara CowerJason, MD 12/05/17 [phone]

## 2017-12-05 NOTE — Discharge Instructions (Addendum)
Your lab work showed that you have findings consistent with bacterial vaginosis, please see attached handout. We are treating this with flagyl, this is an antibiotic. Please do not consume alcohol with this antibiotic as it can have extremely dangerous side effects.   We have prescribed you new medication(s) today. Discuss the medications prescribed today with your pharmacist as they can have adverse effects and interactions with your other medicines including over the counter and prescribed medications. Seek medical evaluation if you start to experience new or abnormal symptoms after taking one of these medicines, seek care immediately if you start to experience difficulty breathing, feeling of your throat closing, facial swelling, or rash as these could be indications of a more serious allergic reaction  Take tylenol/motrin per over the counter dosing for any continued discomfort  Your pregnancy test was negative. We tested you for gonorrhea, chlamydia, HIV, and Syphilis. We will call you if any results are positive, if positive you will need to informal all sexual partners and seek treatment.   Follow up with women's outpatient clinic within 1 week. Return to the ER for new or worsening symptoms or any other concerns.

## 2017-12-05 NOTE — ED Notes (Signed)
Pt given UA cup and instructed to provide urine sample when possible. Pt verbalized understanding.

## 2017-12-05 NOTE — ED Triage Notes (Signed)
Pt states started to have  vaginal discomfort after 11/27 ,  Denies  Vag d/c or urinary s/s. No vomiting , due to start period soon

## 2017-12-05 NOTE — ED Notes (Signed)
Patient verbalizes understanding of discharge instructions. Opportunity for questioning and answers were provided. Armband removed by staff, pt discharged from ED.  

## 2017-12-06 LAB — HIV ANTIBODY (ROUTINE TESTING W REFLEX): HIV SCREEN 4TH GENERATION: NONREACTIVE

## 2017-12-06 LAB — RPR: RPR Ser Ql: NONREACTIVE

## 2017-12-07 LAB — GC/CHLAMYDIA PROBE AMP (~~LOC~~) NOT AT ARMC
Chlamydia: NEGATIVE
Neisseria Gonorrhea: NEGATIVE

## 2017-12-19 ENCOUNTER — Emergency Department (HOSPITAL_COMMUNITY)
Admission: EM | Admit: 2017-12-19 | Discharge: 2017-12-19 | Disposition: A | Discharge status: Home / Self Care | Payer: Medicaid Other | Attending: Emergency Medicine | Admitting: Emergency Medicine

## 2017-12-19 ENCOUNTER — Encounter: Payer: Self-pay | Admitting: Emergency Medicine

## 2017-12-19 DIAGNOSIS — R51 Headache: Secondary | ICD-10-CM | POA: Diagnosis not present

## 2017-12-19 DIAGNOSIS — E039 Hypothyroidism, unspecified: Secondary | ICD-10-CM | POA: Diagnosis not present

## 2017-12-19 DIAGNOSIS — Z760 Encounter for issue of repeat prescription: Secondary | ICD-10-CM | POA: Diagnosis not present

## 2017-12-19 DIAGNOSIS — Z79899 Other long term (current) drug therapy: Secondary | ICD-10-CM | POA: Insufficient documentation

## 2017-12-19 MED ORDER — LEVOTHYROXINE SODIUM 100 MCG PO TABS: 100.0000 ug | ORAL_TABLET | Freq: Every day | ORAL | 0 refills | Status: DC

## 2017-12-19 NOTE — ED Provider Notes (Signed)
[facility] EMERGENCY DEPARTMENT Provider Note   CSN: [id number] Arrival date & time: 12/19/17  1430     History   Chief Complaint No chief complaint on file.   HPI Jackson Fetters is a 22 y.o. female.  HPI   22 year old female with a history of hypothyroidism presents today for medication refill.  Patient notes that she recently moved down here from [place] and recently ran out of her medication.  She has not had her medication the last several days.  He notes she is currently taking 100 once daily and has been taking this for 6 months.  She notes she was started on this medication in 2016.  Patient notes over the last week she has had migraines coming and going worse in the morning relieved throughout the day.  She notes she had a minor one this morning that was resolved with Excedrin with no associated neurological deficits.  She notes he is feels similar to previous episodes of migraines.   Past Medical History:  Diagnosis Date  . Thyroid disease     There are no active problems to display for this patient.   No past surgical history on file.   OB History   No obstetric history on file.      Home Medications    Prior to Admission medications   Medication Sig Start Date End Date Taking? Authorizing Provider  levothyroxine (SYNTHROID, LEVOTHROID) 100 MCG tablet Take 1 tablet (100 mcg total) by mouth daily before breakfast. 12/19/17   Blakeleigh Domek, Tinnie Gens, PA-C  metroNIDAZOLE (FLAGYL) 500 MG tablet Take 1 tablet (500 mg total) by mouth 2 (two) times daily. 12/05/17   Petrucelli, Pleas Koch, PA-C    Family History No family history on file.  Social History Social History   Tobacco Use  . Smoking status: Never Smoker  . Smokeless tobacco: Never Used  Substance Use Topics  . Alcohol use: Yes  . Drug use: Not Currently     Allergies   Patient has no known allergies.   Review of Systems Review of Systems  All other systems reviewed and  are negative.  Physical Exam Updated Vital Signs BP 124/82 (BP Location: Right Arm)   Pulse 86   Temp 98.3 F (36.8 C) (Oral)   Resp 16   SpO2 100%   Physical Exam Vitals signs and nursing note reviewed.  Constitutional:      Appearance: She is well-developed.  HENT:     Head: Normocephalic and atraumatic.  Eyes:     General: No scleral icterus.       Right eye: No discharge.        Left eye: No discharge.     Conjunctiva/sclera: Conjunctivae normal.     Pupils: Pupils are equal, round, and reactive to light.  Neck:     Musculoskeletal: Normal range of motion.     Vascular: No JVD.     Trachea: No tracheal deviation.     Comments: Neck supple without swelling Pulmonary:     Effort: Pulmonary effort is normal.     Breath sounds: No stridor.  Neurological:     Mental Status: She is alert and oriented to person, place, and time.     GCS: GCS eye subscore is 4. GCS verbal subscore is 5. GCS motor subscore is 6.     Sensory: Sensation is intact. No sensory deficit.     Motor: Motor function is intact.     Coordination: Coordination is intact. Coordination  normal.  Psychiatric:        Behavior: Behavior normal.        Thought Content: Thought content normal.        Judgment: Judgment normal.     ED Treatments / Results  Labs (all labs ordered are listed, but only abnormal results are displayed) Labs Reviewed - No data to display  EKG None  Radiology No results found.  Procedures Procedures (including critical care time)  Medications Ordered in ED Medications - No data to display   Initial Impression / Assessment and Plan / ED Course  I have reviewed the triage vital signs and the nursing notes.  Pertinent labs & imaging results that were available during my care of the patient were reviewed by me and considered in my medical decision making (see chart for details).      Assessment/Plan: 22 year old female presents today for medication refill.  I do find  it reasonable to refill medication at this time.  She will follow-up as an outpatient with primary care.  She has been having migraines, nonsevere typical of previous, no headache or neurological deficits present now.  Patient will follow-up as an outpatient return precautions given.  She verbalized understanding and agreement to today's plan.    Final Clinical Impressions(s) / ED Diagnoses   Final diagnoses:  Hypothyroidism, unspecified type    ED Discharge Orders         Ordered    levothyroxine (SYNTHROID, LEVOTHROID) 100 MCG tablet  Daily before breakfast     12/19/17 1515           Eyvonne MechanicHedges, Zelie Asbill, PA-C 12/19/17 1527    Long, Arlyss RepressJoshua G, MD 12/19/17 2025

## 2017-12-19 NOTE — Discharge Instructions (Addendum)
Please read attached information. If you experience any new or worsening signs or symptoms please return to the emergency room for evaluation. Please follow-up with your primary care provider or specialist as discussed. Please use medication prescribed only as directed and discontinue taking if you have any concerning signs or symptoms.   °

## 2017-12-19 NOTE — ED Notes (Signed)
Discharge instructions discussed with Pt. Pt verbalized understanding. Pt stable and ambulatory.    

## 2017-12-27 ENCOUNTER — Other Ambulatory Visit: Payer: Self-pay

## 2017-12-27 ENCOUNTER — Emergency Department (HOSPITAL_COMMUNITY)
Admission: EM | Admit: 2017-12-27 | Discharge: 2017-12-27 | Disposition: A | Discharge status: Home / Self Care | Payer: Medicaid Other | Attending: Emergency Medicine | Admitting: Emergency Medicine

## 2017-12-27 ENCOUNTER — Emergency Department (HOSPITAL_COMMUNITY): Payer: Medicaid Other

## 2017-12-27 ENCOUNTER — Encounter (HOSPITAL_COMMUNITY): Payer: Self-pay

## 2017-12-27 DIAGNOSIS — R102 Pelvic and perineal pain: Secondary | ICD-10-CM

## 2017-12-27 DIAGNOSIS — E079 Disorder of thyroid, unspecified: Secondary | ICD-10-CM | POA: Diagnosis not present

## 2017-12-27 DIAGNOSIS — N83201 Unspecified ovarian cyst, right side: Secondary | ICD-10-CM | POA: Diagnosis not present

## 2017-12-27 DIAGNOSIS — N83291 Other ovarian cyst, right side: Secondary | ICD-10-CM | POA: Diagnosis not present

## 2017-12-27 DIAGNOSIS — N898 Other specified noninflammatory disorders of vagina: Secondary | ICD-10-CM | POA: Diagnosis present

## 2017-12-27 DIAGNOSIS — Z79899 Other long term (current) drug therapy: Secondary | ICD-10-CM | POA: Insufficient documentation

## 2017-12-27 LAB — CBC WITH DIFFERENTIAL/PLATELET
Abs Immature Granulocytes: 0.01 10*3/uL (ref 0.00–0.07)
Basophils Absolute: 0.1 10*3/uL (ref 0.0–0.1)
Basophils Relative: 1 %
Eosinophils Absolute: 0.3 10*3/uL (ref 0.0–0.5)
Eosinophils Relative: 5 %
HCT: 34.8 % — ABNORMAL LOW (ref 36.0–46.0)
HEMOGLOBIN: 11.7 g/dL — AB (ref 12.0–15.0)
Immature Granulocytes: 0 %
Lymphocytes Relative: 31 %
Lymphs Abs: 2 10*3/uL (ref 0.7–4.0)
MCH: 29.8 pg (ref 26.0–34.0)
MCHC: 33.6 g/dL (ref 30.0–36.0)
MCV: 88.5 fL (ref 80.0–100.0)
MONO ABS: 0.6 10*3/uL (ref 0.1–1.0)
Monocytes Relative: 9 %
Neutro Abs: 3.5 10*3/uL (ref 1.7–7.7)
Neutrophils Relative %: 54 %
Platelets: 227 10*3/uL (ref 150–400)
RBC: 3.93 MIL/uL (ref 3.87–5.11)
RDW: 13 % (ref 11.5–15.5)
WBC: 6.4 10*3/uL (ref 4.0–10.5)
nRBC: 0 % (ref 0.0–0.2)

## 2017-12-27 LAB — WET PREP, GENITAL
Sperm: NONE SEEN
Trich, Wet Prep: NONE SEEN
Yeast Wet Prep HPF POC: NONE SEEN

## 2017-12-27 LAB — URINALYSIS, ROUTINE W REFLEX MICROSCOPIC
Bilirubin Urine: NEGATIVE
GLUCOSE, UA: NEGATIVE mg/dL
Hgb urine dipstick: NEGATIVE
Ketones, ur: NEGATIVE mg/dL
Nitrite: NEGATIVE
Protein, ur: NEGATIVE mg/dL
Specific Gravity, Urine: 1.02 (ref 1.005–1.030)
pH: 7 (ref 5.0–8.0)

## 2017-12-27 LAB — URINALYSIS, MICROSCOPIC (REFLEX)
Bacteria, UA: NONE SEEN
RBC / HPF: NONE SEEN RBC/hpf (ref 0–5)

## 2017-12-27 LAB — POC URINE PREG, ED: Preg Test, Ur: NEGATIVE

## 2017-12-27 MED ORDER — NAPROXEN 375 MG PO TABS: 375.0000 mg | ORAL_TABLET | Freq: Two times a day (BID) | ORAL | 0 refills | Status: DC

## 2017-12-27 NOTE — ED Triage Notes (Signed)
Pt states vaginal discomfort after changing soaps. Was diagnosed with infection and reports some odor.

## 2017-12-27 NOTE — Discharge Instructions (Signed)
[facility]

## 2017-12-27 NOTE — ED Notes (Signed)
Pt returned from US

## 2017-12-27 NOTE — ED Notes (Signed)
Please note: in addition to CBC, 1 dark green, 1 light green, and 2 gold tops were also collected.  Dark green is on rocker in Mini Lab, [facility], and the rest are in the 'Main Lab' tray at nurse's station.

## 2017-12-27 NOTE — ED Provider Notes (Signed)
[facility] EMERGENCY DEPARTMENT Provider Note   CSN: [id number] Arrival date & time: 12/27/17  1336     History   Chief Complaint Chief Complaint  Patient presents with  . Vaginal Discharge    HPI Victoria Miller is a 22 y.o. female who presents to the ED with vaginal d/c that is yellow. Patient reports mild lower abdominal cramping. LMP 12/06/17. Patient was evaluated 3 weeks ago for discharge but was not having pain at that time. She was treated for BV. Patient reports no sex since last visit. Today patient reports cramping and increased pain in the right lower abdominal area.   HPI  Past Medical History:  Diagnosis Date  . Thyroid disease     There are no active problems to display for this patient.   History reviewed. No pertinent surgical history.   OB History   No obstetric history on file.      Home Medications    Prior to Admission medications   Medication Sig Start Date End Date Taking? Authorizing Provider  levothyroxine (SYNTHROID, LEVOTHROID) 100 MCG tablet Take 1 tablet (100 mcg total) by mouth daily before breakfast. 12/19/17   Hedges, Tinnie GensJeffrey, PA-C  metroNIDAZOLE (FLAGYL) 500 MG tablet Take 1 tablet (500 mg total) by mouth 2 (two) times daily. 12/05/17   Petrucelli, Samantha R, PA-C  naproxen (NAPROSYN) 375 MG tablet Take 1 tablet (375 mg total) by mouth 2 (two) times daily. 12/27/17   Janne NapoleonNeese, Maddisen Vought M, NP    Family History History reviewed. No pertinent family history.  Social History Social History   Tobacco Use  . Smoking status: Never Smoker  . Smokeless tobacco: Never Used  Substance Use Topics  . Alcohol use: Yes  . Drug use: Not Currently     Allergies   Patient has no known allergies.   Review of Systems Review of Systems  Constitutional: Negative for chills and fever.  HENT: Negative.   Eyes: Negative for visual disturbance.  Respiratory: Negative for cough.   Cardiovascular: Negative for chest pain.    Gastrointestinal: Positive for abdominal pain. Negative for nausea and vomiting.  Genitourinary: Positive for pelvic pain and vaginal discharge. Negative for frequency and urgency.  Musculoskeletal: Negative for back pain.  Skin: Negative for rash.  Neurological: Negative for syncope.  Psychiatric/Behavioral: Negative for confusion.     Physical Exam Updated Vital Signs BP 113/68 (BP Location: Right Arm)   Pulse 64   Temp 98.4 F (36.9 C) (Oral)   Resp 12   Ht 5\' 11"  (1.803 m)   Wt 68 kg   LMP 12/06/2017 (Within Days)   SpO2 100%   BMI 20.92 kg/m   Physical Exam Vitals signs and nursing note reviewed. Exam conducted with a chaperone present.  Constitutional:      Appearance: She is well-developed.  HENT:     Head: Normocephalic.  Neck:     Musculoskeletal: Neck supple.  Cardiovascular:     Rate and Rhythm: Normal rate.  Pulmonary:     Effort: Pulmonary effort is normal.  Abdominal:     Palpations: Abdomen is soft.     Tenderness: There is no abdominal tenderness.  Genitourinary:    Exam position: Lithotomy position.     Comments: External genitalia without lesions. Mucous d/c vaginal vault, cervix inflamed, no CMT, right adnexal tenderness. Uterus without palpable enlargement.  Musculoskeletal: Normal range of motion.  Skin:    General: Skin is warm and dry.  Neurological:     Mental  Status: She is alert and oriented to person, place, and time.  Psychiatric:        Mood and Affect: Mood normal.      ED Treatments / Results  Labs (all labs ordered are listed, but only abnormal results are displayed) Labs Reviewed  WET PREP, GENITAL - Abnormal; Notable for the following components:      Result Value   Clue Cells Wet Prep HPF POC PRESENT (*)    WBC, Wet Prep HPF POC FEW (*)    All other components within normal limits  URINALYSIS, ROUTINE W REFLEX MICROSCOPIC - Abnormal; Notable for the following components:   Leukocytes, UA MODERATE (*)    All other  components within normal limits  CBC WITH DIFFERENTIAL/PLATELET - Abnormal; Notable for the following components:   Hemoglobin 11.7 (*)    HCT 34.8 (*)    All other components within normal limits  URINALYSIS, MICROSCOPIC (REFLEX)  POC URINE PREG, ED  GC/CHLAMYDIA PROBE AMP ([place]) NOT AT [facility]     Radiology [place] Pelvic Complete With Transvaginal  Result Date: 12/27/2017 CLINICAL DATA:  Pelvic pain in a female, RIGHT lower quadrant pain for 1 week EXAM: TRANSABDOMINAL AND TRANSVAGINAL ULTRASOUND OF PELVIS TECHNIQUE: Both transabdominal and transvaginal ultrasound examinations of the pelvis were performed. Transabdominal technique was performed for global imaging of the pelvis including uterus, ovaries, adnexal regions, and pelvic cul-de-sac. It was necessary to proceed with endovaginal exam following the transabdominal exam to visualize the ovaries. COMPARISON:  None FINDINGS: Uterus Measurements: 7.8 x 4.9 x 5.9 cm = volume: 111.7 mL. Anteverted. No focal uterine mass. Endometrium Thickness: 8 mm thick, normal. No endometrial fluid or focal abnormality Right ovary Measurements: 3.6 x 3.8 x 3.3 cm = volume: 23.8 mL. Small collapsed hemorrhagic ruptured follicle cyst 2.3 x 1.8 x 2.5 cm. No additional masses. Blood flow present within RIGHT ovary on color Doppler imaging. Left ovary Measurements: 2.6 x 1.6 x 2.7 cm = volume: 5.9 mL. Normal morphology without mass. Blood flow present within LEFT ovary on color Doppler imaging. Other findings Small amount of nonspecific free pelvic fluid. No other adnexal masses. IMPRESSION: Small ruptured follicle cyst RIGHT ovary with a small amount of nonspecific free pelvic fluid. Otherwise negative exam. Electronically Signed   By: Ulyses SouthwardMark  Boles M.D.   On: 12/27/2017 16:28    Procedures Procedures (including critical care time)  Medications Ordered in ED Medications - No data to display   Initial Impression / Assessment and Plan / ED Course  I have  reviewed the triage vital signs and the nursing notes. 22 y.o. female here with lower abdominal cramping stable for d/c with ruptured ovarian cyst on ultrasound. Patient treated with Naprosyn for pain. [facility] Return precautions discussed. Patient agrees with plan. She will finish her antibiotic for BV.   Final Clinical Impressions(s) / ED Diagnoses   Final diagnoses:  Pelvic pain in female  Ovarian cyst, right    ED Discharge Orders         Ordered    naproxen (NAPROSYN) 375 MG tablet  2 times daily     12/27/17 1639           Kerrie Buffaloeese, Malka Bocek Del ReyM, [place] 12/27/17 1722    Doug SouJacubowitz, Sam, MD 12/27/17 1756

## 2017-12-27 NOTE — ED Notes (Signed)
Pt. To ultrasound.

## 2017-12-28 LAB — GC/CHLAMYDIA PROBE AMP (~~LOC~~) NOT AT ARMC
Chlamydia: NEGATIVE
Neisseria Gonorrhea: NEGATIVE

## 2018-01-08 ENCOUNTER — Encounter: Payer: Self-pay | Admitting: Critical Care Medicine

## 2018-01-08 ENCOUNTER — Ambulatory Visit: Payer: Medicaid Other | Attending: Critical Care Medicine | Admitting: Critical Care Medicine

## 2018-01-08 VITALS — BP 108/68 | HR 70 | Temp 98.5°F | Resp 18 | Ht 71.0 in | Wt 160.0 lb

## 2018-01-08 DIAGNOSIS — B9689 Other specified bacterial agents as the cause of diseases classified elsewhere: Secondary | ICD-10-CM | POA: Insufficient documentation

## 2018-01-08 DIAGNOSIS — Z791 Long term (current) use of non-steroidal anti-inflammatories (NSAID): Secondary | ICD-10-CM | POA: Insufficient documentation

## 2018-01-08 DIAGNOSIS — E039 Hypothyroidism, unspecified: Secondary | ICD-10-CM | POA: Insufficient documentation

## 2018-01-08 DIAGNOSIS — N760 Acute vaginitis: Secondary | ICD-10-CM | POA: Insufficient documentation

## 2018-01-08 DIAGNOSIS — N83209 Unspecified ovarian cyst, unspecified side: Secondary | ICD-10-CM

## 2018-01-08 DIAGNOSIS — N8301 Follicular cyst of right ovary: Secondary | ICD-10-CM | POA: Diagnosis not present

## 2018-01-08 DIAGNOSIS — N83201 Unspecified ovarian cyst, right side: Secondary | ICD-10-CM | POA: Insufficient documentation

## 2018-01-08 DIAGNOSIS — R51 Headache: Secondary | ICD-10-CM | POA: Diagnosis not present

## 2018-01-08 DIAGNOSIS — D573 Sickle-cell trait: Secondary | ICD-10-CM | POA: Insufficient documentation

## 2018-01-08 DIAGNOSIS — Z23 Encounter for immunization: Secondary | ICD-10-CM | POA: Diagnosis not present

## 2018-01-08 DIAGNOSIS — R102 Pelvic and perineal pain: Secondary | ICD-10-CM

## 2018-01-08 HISTORY — DX: Unspecified ovarian cyst, unspecified side: N83.209

## 2018-01-08 NOTE — Assessment & Plan Note (Signed)
The patient agrees to the flu vaccine at this visit

## 2018-01-08 NOTE — Assessment & Plan Note (Signed)
Hypothyroidism by history  Plan Recheck thyroid panel We will maintain Synthroid at 100 mcg daily until results of the thyroid panel return May need to adjust thyroid medications further

## 2018-01-08 NOTE — Assessment & Plan Note (Signed)
History of bacterial vaginosis  This appears to be resolved status post Flagyl therapy  We will plan continued observation

## 2018-01-08 NOTE — Patient Instructions (Signed)
A thyroid panel will be obtained  We will call with results of the thyroid panel and determine how much thyroid medicine to send to your pharmacy on Monday, January 6  Continue use the Naprosyn as needed for pain  A visit within the next 2 weeks for primary care to establish and complete pelvic exam will be obtained  A flu shot was given at this visit

## 2018-01-08 NOTE — Progress Notes (Signed)
Subjective:    Patient ID: Victoria Miller, female    DOB: [birth year], 23 y.o.   MRN: [id number]  23 y.o.F recently seen ED 12/27/17 for pelvic pain  and vaginal inflammation.   u/s showed ruptured ovarian follicle cyst on R ovary.  Clue cells seen on wet prep. D/c on naprosyn and asked to f/u at GYN clinic.      Note this patient is new to the area having just me moved from [place] about a month ago  The patient is yet to establish with primary care.  She has a 16-month-old baby and does have [place].  She was told when she was pregnant she did have a right ovarian cyst.  She was seen November 30 in the emergency room for bacterial vaginosis and treated with Flagyl.  She states the pain in the right pelvic area has improved on Naprosyn.  She denies any vaginal discharges currently.  She notes also she is having her current period  She has a history of hypothyroidism prior to moving to [place].  She had been increased to Synthroid 100 mcg daily during her pregnancy.  She is not had thyroid function done recently.  See thyroid assessment below   Thyroid Problem  Presents for initial visit. The condition has lasted for 1 year. Symptoms include anxiety, cold intolerance, constipation, fatigue, hair loss, weight gain and weight loss. Patient reports no diaphoresis, diarrhea, dry skin, heat intolerance, hoarse voice, leg swelling, menstrual problem, nail problem, palpitations, tremors or visual change. (Weight is up and down Has headaches:  Right and Left sides) The symptoms have been worsening. Past treatments include levothyroxine. The treatment provided mild relief. There is no history of atrial fibrillation, dementia, diabetes, heart failure, hyperlipidemia or neuropathy. There are no known risk factors.   Past Medical History:  Diagnosis Date  . Thyroid disease      History reviewed. No pertinent family history.   Social History   Socioeconomic History  .  Marital status: Single    Spouse name: Not on file  . Number of children: Not on file  . Years of education: Not on file  . Highest education level: Not on file  Occupational History  . Not on file  Social Needs  . Financial resource strain: Not on file  . Food insecurity:    Worry: Not on file    Inability: Not on file  . Transportation needs:    Medical: Not on file    Non-medical: Not on file  Tobacco Use  . Smoking status: Never Smoker  . Smokeless tobacco: Never Used  Substance and Sexual Activity  . Alcohol use: Yes  . Drug use: Not Currently  . Sexual activity: Yes  Lifestyle  . Physical activity:    Days per week: Not on file    Minutes per session: Not on file  . Stress: Not on file  Relationships  . Social connections:    Talks on phone: Not on file    Gets together: Not on file    Attends religious service: Not on file    Active member of club or organization: Not on file    Attends meetings of clubs or organizations: Not on file    Relationship status: Not on file  . Intimate partner violence:    Fear of current or ex partner: Not on file    Emotionally abused: Not on file    Physically abused: Not on file    Forced  sexual activity: Not on file  Other Topics Concern  . Not on file  Social History Narrative  . Not on file     No Known Allergies   Outpatient Medications Prior to Visit  Medication Sig Dispense Refill  . levothyroxine (SYNTHROID, LEVOTHROID) 100 MCG tablet Take 1 tablet (100 mcg total) by mouth daily before breakfast. 30 tablet 0  . naproxen (NAPROSYN) 375 MG tablet Take 1 tablet (375 mg total) by mouth 2 (two) times daily. 20 tablet 0  . metroNIDAZOLE (FLAGYL) 500 MG tablet Take 1 tablet (500 mg total) by mouth 2 (two) times daily. 14 tablet 0   No facility-administered medications prior to visit.    Review of Systems  Constitutional: Positive for fatigue, weight gain and weight loss. Negative for diaphoresis.  HENT: Negative for  hoarse voice.   Cardiovascular: Negative for palpitations.  Gastrointestinal: Positive for constipation. Negative for diarrhea.  Endocrine: Positive for cold intolerance. Negative for heat intolerance.  Genitourinary: Negative for menstrual problem.  Neurological: Negative for tremors.  Psychiatric/Behavioral: The patient is nervous/anxious.        Objective:   Physical Exam Vitals:   01/08/18 0948  BP: 108/68  Pulse: 70  Resp: 18  Temp: 98.5 F (36.9 C)  TempSrc: Oral  SpO2: 99%  Weight: 160 lb (72.6 kg)  Height: 5\' 11"  (1.803 m)    Gen: Pleasant, well-nourished, in no distress,  normal affect  ENT: No lesions,  mouth clear,  oropharynx clear, no postnasal drip  Neck: No JVD, no TMG, no carotid bruits  Lungs: No use of accessory muscles, no dullness to percussion, clear without rales or rhonchi  Cardiovascular: RRR, heart sounds normal, no murmur or gallops, no peripheral edema  Abdomen: soft slight tender RLQ, no HSM,  BS normal  Musculoskeletal: No deformities, no cyanosis or clubbing  Neuro: alert, non focal  Skin: Warm, no lesions or rashes  No results found.  Pelvic u/s 12/22: IMPRESSION: Small ruptured follicle cyst RIGHT ovary with a small amount of nonspecific free pelvic fluid.  Otherwise negative exam.  No labs in system      Assessment & Plan:  I personally reviewed all images and lab data in the [facility] system as well as any outside material available during this office visit and agree with the  radiology impressions.   Hypothyroidism Hypothyroidism by history  Plan Recheck thyroid panel We will maintain Synthroid at 100 mcg daily until results of the thyroid panel return May need to adjust thyroid medications further  Ruptured ovarian cyst Status post right follicular ovarian cyst rupture with pelvic pain  Plan  Patient will return in 2 weeks for complete physical including pelvic exam and may need repeat ultrasound  May yet need  referral back to gynecology  Bacterial vaginosis History of bacterial vaginosis  This appears to be resolved status post Flagyl therapy  We will plan continued observation  Need for immunization against influenza The patient agrees to the flu vaccine at this visit   Geniya was seen today for hospitalization follow-up.  Diagnoses and all orders for this visit:  Acquired hypothyroidism -     Thyroid Profile  Sickle cell trait (HCC)  Need for immunization against influenza -     Flu Vaccine QUAD 36+ mos IM  Ruptured ovarian cyst  Bacterial vaginosis

## 2018-01-08 NOTE — Assessment & Plan Note (Signed)
Status post right follicular ovarian cyst rupture with pelvic pain  Plan  Patient will return in 2 weeks for complete physical including pelvic exam and may need repeat ultrasound  May yet need referral back to gynecology

## 2018-01-09 LAB — THYROID PANEL
Free Thyroxine Index: 2.6 (ref 1.2–4.9)
T3 Uptake Ratio: 28 % (ref 24–39)
T4, Total: 9.2 ug/dL (ref 4.5–12.0)

## 2018-01-11 ENCOUNTER — Telehealth: Payer: Self-pay | Admitting: Critical Care Medicine

## 2018-01-11 ENCOUNTER — Encounter (HOSPITAL_COMMUNITY): Payer: Self-pay | Admitting: *Deleted

## 2018-01-11 ENCOUNTER — Emergency Department (HOSPITAL_COMMUNITY)
Admission: EM | Admit: 2018-01-11 | Discharge: 2018-01-11 | Disposition: A | Discharge status: Home / Self Care | Payer: Medicaid Other | Attending: Emergency Medicine | Admitting: Emergency Medicine

## 2018-01-11 DIAGNOSIS — B9689 Other specified bacterial agents as the cause of diseases classified elsewhere: Secondary | ICD-10-CM

## 2018-01-11 DIAGNOSIS — N760 Acute vaginitis: Secondary | ICD-10-CM | POA: Diagnosis not present

## 2018-01-11 DIAGNOSIS — N898 Other specified noninflammatory disorders of vagina: Secondary | ICD-10-CM | POA: Diagnosis present

## 2018-01-11 DIAGNOSIS — E079 Disorder of thyroid, unspecified: Secondary | ICD-10-CM | POA: Insufficient documentation

## 2018-01-11 DIAGNOSIS — B9789 Other viral agents as the cause of diseases classified elsewhere: Secondary | ICD-10-CM | POA: Diagnosis not present

## 2018-01-11 DIAGNOSIS — Z79899 Other long term (current) drug therapy: Secondary | ICD-10-CM | POA: Diagnosis not present

## 2018-01-11 LAB — WET PREP, GENITAL
Sperm: NONE SEEN
Trich, Wet Prep: NONE SEEN
Yeast Wet Prep HPF POC: NONE SEEN

## 2018-01-11 MED ORDER — METRONIDAZOLE 500 MG PO TABS: 500.0000 mg | ORAL_TABLET | Freq: Two times a day (BID) | ORAL | 0 refills | Status: DC

## 2018-01-11 MED ORDER — LEVOTHYROXINE SODIUM 100 MCG PO TABS: 100.0000 ug | ORAL_TABLET | Freq: Every day | ORAL | 6 refills | Status: DC

## 2018-01-11 NOTE — Telephone Encounter (Signed)
I spoke to patient and told her thyroid levels normal  Needs to be on levothyroxine daily  Refills sent to our pharmacy.  Shan Levans

## 2018-01-11 NOTE — Progress Notes (Signed)
Patient aware of results.  See telephone note

## 2018-01-11 NOTE — ED Triage Notes (Signed)
Pt here for vaginal discharge and odor with mod amt of white discharge onset x 2 wks, pt reports vaginal pain on intercourse, pt denies dysuria, A&Ox4

## 2018-01-11 NOTE — ED Provider Notes (Signed)
[facility] EMERGENCY DEPARTMENT Provider Note   CSN: [id number] Arrival date & time: 01/11/18  1249     History   Chief Complaint Chief Complaint  Patient presents with  . Vaginal Discharge    HPI Victoria Miller is a 23 y.o. female.  HPI   23 year old female presents today with complaints of vaginal discharge.  Patient notes a 2-week history of pelvic pain vaginal discharge and odor.  Patient was seen several weeks prior to that with diagnosis of BV.  Patient had ultrasound showing ruptured ovarian cyst.  Patient instructed follow-up with GYN clinic at [facility].   Past Medical History:  Diagnosis Date  . Thyroid disease     Patient Active Problem List   Diagnosis Date Noted  . Sickle cell trait (HCC) 01/08/2018  . Hypothyroidism 01/08/2018  . Ruptured ovarian cyst 01/08/2018  . Bacterial vaginosis 01/08/2018    History reviewed. No pertinent surgical history.   OB History   No obstetric history on file.      Home Medications    Prior to Admission medications   Medication Sig Start Date End Date Taking? Authorizing Provider  levothyroxine (SYNTHROID, LEVOTHROID) 100 MCG tablet Take 1 tablet (100 mcg total) by mouth daily before breakfast. 01/11/18   Storm Frisk, MD  metroNIDAZOLE (FLAGYL) 500 MG tablet Take 1 tablet (500 mg total) by mouth 2 (two) times daily. 01/11/18   Vaiden Adames, Tinnie Gens, PA-C  naproxen (NAPROSYN) 375 MG tablet Take 1 tablet (375 mg total) by mouth 2 (two) times daily. 12/27/17   Janne Napoleon, NP    Family History No family history on file.  Social History Social History   Tobacco Use  . Smoking status: Never Smoker  . Smokeless tobacco: Never Used  Substance Use Topics  . Alcohol use: Yes  . Drug use: Not Currently     Allergies   Patient has no known allergies.   Review of Systems Review of Systems  All other systems reviewed and are negative.   Physical Exam Updated Vital Signs BP (!) 158/87  (BP Location: Right Arm)   Pulse 80   Temp 97.9 F (36.6 C) (Oral)   Resp 18   LMP 01/08/2018   SpO2 99%   Physical Exam Vitals signs and nursing note reviewed.  Constitutional:      Appearance: She is well-developed.  HENT:     Head: Normocephalic and atraumatic.  Eyes:     General: No scleral icterus.       Right eye: No discharge.        Left eye: No discharge.     Conjunctiva/sclera: Conjunctivae normal.     Pupils: Pupils are equal, round, and reactive to light.  Neck:     Musculoskeletal: Normal range of motion.     Vascular: No JVD.     Trachea: No tracheal deviation.  Pulmonary:     Effort: Pulmonary effort is normal.     Breath sounds: No stridor.  Neurological:     Mental Status: She is alert and oriented to person, place, and time.     Coordination: Coordination normal.  Psychiatric:        Behavior: Behavior normal.        Thought Content: Thought content normal.        Judgment: Judgment normal.     ED Treatments / Results  Labs (all labs ordered are listed, but only abnormal results are displayed) Labs Reviewed  WET PREP, GENITAL - Abnormal;  Notable for the following components:      Result Value   Clue Cells Wet Prep HPF POC PRESENT (*)    WBC, Wet Prep HPF POC MANY (*)    All other components within normal limits  GC/CHLAMYDIA PROBE AMP ([place]) NOT AT [facility]    EKG None  Radiology No results found.  Procedures Procedures (including critical care time)  Medications Ordered in ED Medications - No data to display   Initial Impression / Assessment and Plan / ED Course  I have reviewed the triage vital signs and the nursing notes.  Pertinent labs & imaging results that were available during my care of the patient were reviewed by me and considered in my medical decision making (see chart for details).     Labs: wet prep  Imaging:  Consults:  Therapeutics:  Discharge Meds: Metronidazole  Assessment/Plan: 23 year old female  presents today with vaginal discharge.  Patient has clue cells on her wet prep, exam most consistent with bacterial vaginosis.  Patient has no signs of pelvic inflammatory disease or purulent infection.  Abdomen soft nontender encouraged follow-up as an outpatient with OB/GYN.  She verbalized understanding and agreement to today's plan had no further questions or concerns.   Final Clinical Impressions(s) / ED Diagnoses   Final diagnoses:  BV (bacterial vaginosis)    ED Discharge Orders         Ordered    metroNIDAZOLE (FLAGYL) 500 MG tablet  2 times daily     01/11/18 1424           Eyvonne Mechanic, PA-C 01/11/18 1505    Sabas Sous, MD 01/11/18 [phone]

## 2018-01-11 NOTE — Discharge Instructions (Signed)
Please read attached information. If you experience any new or worsening signs or symptoms please return to the emergency room for evaluation. Please follow-up with your primary care provider or specialist as discussed. Please use medication prescribed only as directed and discontinue taking if you have any concerning signs or symptoms.   °

## 2018-01-12 LAB — GC/CHLAMYDIA PROBE AMP (~~LOC~~) NOT AT ARMC
Chlamydia: NEGATIVE
Neisseria Gonorrhea: NEGATIVE

## 2018-01-22 MED FILL — LEVOTHYROXINE 100 MCG TAB: 100 | 30 days supply | Qty: 30 | Fill #0

## 2018-01-28 ENCOUNTER — Other Ambulatory Visit: Payer: Self-pay

## 2018-01-28 ENCOUNTER — Encounter: Payer: Self-pay | Admitting: Obstetrics and Gynecology

## 2018-01-28 ENCOUNTER — Ambulatory Visit: Payer: Medicaid Other | Admitting: Obstetrics and Gynecology

## 2018-01-28 ENCOUNTER — Other Ambulatory Visit (HOSPITAL_COMMUNITY)
Admission: RE | Admit: 2018-01-28 | Discharge: 2018-01-28 | Disposition: A | Discharge status: Home / Self Care | Payer: Medicaid Other | Source: Ambulatory Visit | Attending: Obstetrics and Gynecology | Admitting: Obstetrics and Gynecology

## 2018-01-28 VITALS — BP 126/75 | HR 71 | Temp 98.4°F | Ht 71.0 in | Wt 159.6 lb

## 2018-01-28 DIAGNOSIS — Z3202 Encounter for pregnancy test, result negative: Secondary | ICD-10-CM

## 2018-01-28 DIAGNOSIS — Z3042 Encounter for surveillance of injectable contraceptive: Secondary | ICD-10-CM

## 2018-01-28 DIAGNOSIS — N898 Other specified noninflammatory disorders of vagina: Secondary | ICD-10-CM | POA: Diagnosis not present

## 2018-01-28 DIAGNOSIS — Z3009 Encounter for other general counseling and advice on contraception: Secondary | ICD-10-CM

## 2018-01-28 LAB — POCT URINE PREGNANCY: Preg Test, Ur: NEGATIVE

## 2018-01-28 MED ORDER — MEDROXYPROGESTERONE ACETATE 150 MG/ML IM SUSP
150.0000 mg | Freq: Once | INTRAMUSCULAR | Status: AC
  Administered 2018-01-28: 150 mg via INTRAMUSCULAR

## 2018-01-28 NOTE — Patient Instructions (Signed)
Contraceptive Injection  A contraceptive injection is a shot that prevents pregnancy. It is also called the birth control shot. The shot contains the hormone progestin, which prevents pregnancy by:  · Stopping the ovaries from releasing eggs.  · Thickening cervical mucus to prevent sperm from entering the cervix.  · Thinning the lining of the uterus to prevent a fertilized egg from attaching to the uterus.  Contraceptive injections are given under the skin (subcutaneous) or into a muscle (intramuscular). For these shots to work, you must get one of them every 3 months (12 weeks) from a health care provider.  Tell a health care provider about:  · Any allergies you have.  · All medicines you are taking, including vitamins, herbs, eye drops, creams, and over-the-counter medicines.  · Any blood disorders you have.  · Any medical conditions you have.  · Whether you are pregnant or may be pregnant.  What are the risks?  Generally, this is a safe procedure. However, problems may occur, including:  · Mood changes or depression.  · Loss of bone density (osteoporosis) after long-term use.  · Blood clots.  · Higher risk of an egg being fertilized outside your uterus (ectopic pregnancy).This is rare.  What happens before the procedure?  · Your health care provider may do a routine physical exam.  · You may have a test to make sure you are not pregnant.  What happens during the procedure?  · The area where the shot will be given will be cleaned and sanitized with alcohol.  · A needle will be inserted into a muscle in your upper arm or buttock, or into the skin of your thigh or abdomen. The needle will be attached to a syringe with the medicine inside of it.  · The medicine will be pushed through the syringe and injected into your body.  · A small bandage (dressing) may be placed over the injection site.  What can I expect after the procedure?  · After the procedure, it is common to have:  ? Soreness around the injection site  for a couple of days.  ? Irregular menstrual bleeding.  ? Weight gain.  ? Breast tenderness.  ? Headaches.  ? Discomfort in your abdomen.  · Ask your health care provider whether you need to use an added method of birth control (backup contraception), such as a condom, sponge, or spermicide.  ? If the first shot is given 1-7 days after the start of your last period, you will not need backup contraception.  ? If the first shot is given at any other time during your menstrual cycle, you should avoid having sex or you will need backup contraception for 7 days after you receive the shot.  Follow these instructions at home:  General instructions    · Take over-the-counter and prescription medicines only as told by your health care provider.  · Do not massage the injection site.  · Track your menstrual periods so you will know if they become irregular.  · Always use a condom to protect against STIs (sexually transmitted infections).  · Make sure you schedule an appointment in time for your next shot, and mark it on your calendar. For the birth control to prevent pregnancy, you must get the injections every 3 months (12 weeks).  Lifestyle  · Do not use any products that contain nicotine or tobacco, such as cigarettes and e-cigarettes. If you need help quitting, ask your health care provider.  · Eat foods   that are high in calcium and vitamin D, such as milk, cheese, and salmon. Doing this may help with any loss in bone density that is caused by the contraceptive injection. Ask your health care provider for dietary recommendations.  Contact a health care provider if:  · You have nausea or vomiting.  · You have abnormal vaginal discharge or bleeding.  · You miss a period or you think you might be pregnant.  · You experience mood changes or depression.  · You feel dizzy or light-headed.  · You have leg pain.  Get help right away if:  · You have chest pain.  · You cough up blood.  · You have shortness of breath.  · You have a  severe headache that does not go away.  · You have numbness in any part of your body.  · You have slurred speech.  · You have vision problems.  · You have vaginal bleeding that is abnormally heavy or does not stop.  · You have severe pain in your abdomen.  · You have depression that does not get better with treatment.  If you ever feel like you may hurt yourself or others, or have thoughts about taking your own life, get help right away. You can go to your nearest emergency department or call:  · Your local emergency services (911 in the [place]  · A suicide crisis helpline, such as the National Suicide Prevention Lifeline at 1-800-273-8255. This is open 24 hours a day.  Summary  · A contraceptive injection is a shot that prevents pregnancy. It is also called the birth control shot.  · The shot is given under the skin (subcutaneous) or into a muscle (intramuscular).  · After this procedure, it is common to have soreness around the injection site for a couple of days.  · To prevent pregnancy, the shot must be given by a health care provider every 3 months (12 weeks).  · After you have the shot, ask your health care provider whether you need to use an added method of birth control (backup contraception), such as a condom, sponge, or spermicide.  This information is not intended to replace advice given to you by your health care provider. Make sure you discuss any questions you have with your health care provider.  Document Released: 08/18/2016 Document Revised: 08/18/2016 Document Reviewed: 08/18/2016  Elsevier Interactive Patient Education © 2019 [employer]

## 2018-01-28 NOTE — Progress Notes (Addendum)
  Subjective:     Patient ID: Victoria Miller, female   DOB: 10/18/1995, 23 y.o.   MRN: [id number]  Victoria Miller is 23 y.o. G1P1001 (non-pregnant) presenting for recurrent BV/yeast and to discuss contraceptive management. She has been to urgent care twice for vaginal discharge with odor, and has been treated for BV both times. Now has a thick, white discharge and vaginal itching. She has questions about Depo-Provera vs Nexplanon or pills. She uses condoms for birth control now, but prefers a method with better efficacy rates.     Review of Systems  Constitutional: Negative for activity change, appetite change, diaphoresis, fatigue and unexpected weight change.  HENT: Negative.   Eyes: Negative.   Respiratory: Negative.   Cardiovascular: Negative.   Gastrointestinal: Negative.   Endocrine: Negative for cold intolerance and heat intolerance.       Current hypothyroidism, on synthroid   Genitourinary: Positive for vaginal discharge. Negative for pelvic pain, vaginal bleeding and vaginal pain.  Musculoskeletal: Negative.   Skin: Negative.   Allergic/Immunologic: Negative.   Neurological: Negative.   Hematological: Negative.   Psychiatric/Behavioral: Negative.        Objective:   Physical Exam Constitutional:      Appearance: Normal appearance. She is normal weight.  Genitourinary:    General: Normal vulva.     Vagina: Vaginal discharge (thick, white, no odor) present.  Neurological:     Mental Status: She is alert.  Psychiatric:        Mood and Affect: Mood normal.        Behavior: Behavior normal.        Thought Content: Thought content normal.        Judgment: Judgment normal.        Assessment:     1. Encounter for counseling regarding contraception  2. Vaginal itching - Wet prep  3. Vaginal discharge    Plan:     - Depo-Provera given, patient to reschedule for provider visit if she decides on Nexplanon in 50mo - Instructions for MyChart given - Follow-up in 3  mos  75% of 20 minute encounter spent on counseling about birth control options and recurrent vaginal infections

## 2018-01-29 LAB — CERVICOVAGINAL ANCILLARY ONLY
Bacterial vaginitis: NEGATIVE
Candida vaginitis: NEGATIVE
Chlamydia: NEGATIVE
Neisseria Gonorrhea: NEGATIVE
Trichomonas: NEGATIVE

## 2018-02-08 ENCOUNTER — Encounter: Payer: Self-pay | Admitting: General Practice

## 2018-02-22 MED FILL — LEVOTHYROXINE 100 MCG TAB: 100 | 30 days supply | Qty: 30 | Fill #1

## 2018-03-25 MED FILL — LEVOTHYROXINE 100 MCG TAB: 100 | 30 days supply | Qty: 30 | Fill #2

## 2018-04-19 ENCOUNTER — Ambulatory Visit: Payer: Medicaid Other

## 2018-04-20 ENCOUNTER — Ambulatory Visit: Payer: Medicaid Other

## 2018-04-22 ENCOUNTER — Ambulatory Visit: Payer: Medicaid Other

## 2018-04-27 MED FILL — LEVOTHYROXINE 100 MCG TAB: 100 | 30 days supply | Qty: 30 | Fill #3

## 2018-04-29 ENCOUNTER — Ambulatory Visit: Payer: Medicaid Other | Admitting: Obstetrics and Gynecology

## 2018-04-29 ENCOUNTER — Telehealth: Payer: Self-pay | Admitting: General Practice

## 2018-04-29 NOTE — Telephone Encounter (Signed)
Attempted to call pt 2x @ 1329 and 1331.  Phone would ring and sounds like someone picks up phone to answer, but hangs up and unable to leave a message.

## 2018-06-03 MED FILL — LEVOTHYROXINE 100 MCG TAB: 100 | 90 days supply | Qty: 90 | Fill #4

## 2018-07-22 ENCOUNTER — Telehealth (INDEPENDENT_AMBULATORY_CARE_PROVIDER_SITE_OTHER): Payer: Medicaid Other | Admitting: Women's Health

## 2018-07-22 ENCOUNTER — Encounter: Payer: Self-pay | Admitting: Women's Health

## 2018-07-22 ENCOUNTER — Other Ambulatory Visit: Payer: Self-pay

## 2018-07-22 VITALS — Ht 71.0 in | Wt 170.0 lb

## 2018-07-22 DIAGNOSIS — Z3009 Encounter for other general counseling and advice on contraception: Secondary | ICD-10-CM

## 2018-07-22 MED ORDER — NORETHIN ACE-ETH ESTRAD-FE 1-20 MG-MCG(24) PO TABS: 1.0000 | ORAL_TABLET | Freq: Every day | ORAL | 11 refills | Status: DC

## 2018-07-22 NOTE — Progress Notes (Signed)
TELEHEALTH VIRTUAL GYNECOLOGY VISIT ENCOUNTER NOTE  I connected with Victoria Miller on 07/24/18 at  3:50 PM EDT by MyChart Video at home and verified that I am speaking with the correct person using two identifiers.   I discussed the limitations, risks, security and privacy concerns of performing an evaluation and management service by MyChart Video and the availability of in person appointments. I also discussed with the patient that there may be a patient responsible charge related to this service. The patient expressed understanding and agreed to proceed.   History:  Victoria Miller is a 23 y.o. 281P1001 female being evaluated today for birth control. She denies any abnormal vaginal discharge, bleeding, pelvic pain or other concerns.  Pt reports the only birth control she has used in the past is Depo and she did not like it because it caused her heavy bleeding and HA. Pt reports she is interested in using the birth control pill and thinks she can remember to take it daily because she already remembers to take her Synthroid daily.  PMH includes hypothyroidism and sickle cell trait. Current medications include synthroid only.  Pt last had a Depo injection in February or March 2020 and did not return for next injection and has not had a regular period since. Pt last had intercourse yesterday, but used a condom consistently and correctly for the encounter, without breakage. Aside from this intercourse, pt denies any additional intercourse in the past month.  Pt denies smoking. Pt denies any hx of HTN or other BP issues.   Past Medical History:  Diagnosis Date  . Sickle cell anemia (HCC)    Trait  . Thyroid disease    No past surgical history on file. The following portions of the patient's history were reviewed and updated as appropriate: allergies, current medications, past family history, past medical history, past social history, past surgical history and problem list.   Review of  Systems:  Pertinent items noted in HPI and remainder of comprehensive ROS otherwise negative.  Physical Exam:   General:  Alert, oriented and cooperative.   Mental Status: Normal mood and affect perceived. Normal judgment and thought content.  Physical exam deferred due to nature of the encounter  Labs and Imaging No results found for this or any previous visit (from the past 336 hour(s)). No results found.    Assessment and Plan:     1. Counseling for birth control, oral contraceptives -discussed risks, benefits, warning signs, expected side effects, administration, effectiveness, use of back-up method or abstaining from intercourse for 7days after initiation of pills -pt advised of possible interaction between OCPs and Synthroid that may make Synthroid less effective, and she should seek consultation with her endocrinologist if s/sx of hypothyroidism appear, and should keep all regularly scheduled appts for her thyroid labs/visits -pt to schedule appt with clinic next week for BP check and again in 6weeks aafter starting OCPs, pt aware and agrees with plan -RX given for Loestrin -pt advised to use pills for 3months before switching or discontinuing    I discussed the assessment and treatment plan with the patient. The patient was provided an opportunity to ask questions and all were answered. The patient agreed with the plan and demonstrated an understanding of the instructions.   The patient was advised to call back or seek an in-person evaluation/go to the ED if the symptoms worsen or if the condition fails to improve as anticipated.  I provided 20 minutes of non-face-to-face time during this encounter.  Clarisa Fling, NP [facility], [place]

## 2018-07-22 NOTE — Patient Instructions (Signed)

## 2018-07-27 ENCOUNTER — Other Ambulatory Visit: Payer: Self-pay

## 2018-07-27 ENCOUNTER — Ambulatory Visit (INDEPENDENT_AMBULATORY_CARE_PROVIDER_SITE_OTHER): Payer: Medicaid Other | Admitting: *Deleted

## 2018-07-27 VITALS — BP 109/68 | HR 61 | Temp 99.0°F | Ht 71.0 in | Wt 154.6 lb

## 2018-07-27 DIAGNOSIS — Z793 Long term (current) use of hormonal contraceptives: Secondary | ICD-10-CM

## 2018-07-27 DIAGNOSIS — Z0130 Encounter for examination of blood pressure without abnormal findings: Secondary | ICD-10-CM

## 2018-07-27 NOTE — Progress Notes (Signed)
   Subjective:  Victoria Miller is a 23 y.o. female here for BP check.   Hypertension ROS: no TIA's, no chest pain on exertion, no dyspnea on exertion, no orthostatic dizziness or lightheadedness, no palpitations. Patient currently taking oral contraception x 1 week..    Objective:  BP (!) 159/73 (BP Location: Right Arm, Patient Position: Sitting, Cuff Size: Normal)   Pulse 73   Temp 99.4 F (37.4 C) (Oral) Comment: Patient drinking coffee before appt  Ht 5\' 11"  (1.803 m)   Wt 154 lb 9.6 oz (70.1 kg)   LMP 07/10/2018 (Approximate)   Breastfeeding No   BMI 21.56 kg/m     Today's Vitals   07/27/18 0832 07/27/18 0839  BP: (!) 159/73 109/68  Pulse: 73 61  Temp: 99.4 F (37.4 C) 99 F (37.2 C)  TempSrc: Oral Oral  Weight: 154 lb 9.6 oz (70.1 kg)   Height: 5\' 11"  (1.803 m)   PainSc: 0-No pain    Patient had a cup of coffee before entering the building and rushing trying to make it to appointment on time. Patient does not have a history of increased blood pressure.  Appearance alert, well appearing, and in no distress, oriented to person, place, and time and normal appearing weight. General exam BP noted to be well controlled today in office.    Assessment:   Blood Pressure well controlled.   Plan:  Follow up: 5 weeks for blood pressure check and as needed.  Derl Barrow, RN

## 2018-09-07 ENCOUNTER — Ambulatory Visit: Payer: Medicaid Other

## 2018-10-15 ENCOUNTER — Other Ambulatory Visit: Payer: Self-pay | Admitting: Critical Care Medicine

## 2018-10-15 ENCOUNTER — Encounter: Payer: Self-pay | Admitting: Internal Medicine

## 2018-10-15 ENCOUNTER — Ambulatory Visit: Payer: Medicaid Other | Attending: Internal Medicine | Admitting: Internal Medicine

## 2018-10-15 DIAGNOSIS — E039 Hypothyroidism, unspecified: Secondary | ICD-10-CM

## 2018-10-15 DIAGNOSIS — Z23 Encounter for immunization: Secondary | ICD-10-CM

## 2018-10-15 MED ORDER — LEVOTHYROXINE SODIUM 100 MCG PO TABS: 100.0000 ug | ORAL_TABLET | Freq: Every day | ORAL | 2 refills | Status: DC

## 2018-10-15 NOTE — Progress Notes (Signed)
Patient verified DOB Patient has taken medication today. Patient has eaten today. Patient denies pain at this time. Patient needs refills on levothyroxine and level checks. Patient complains of being cold and fatigue.

## 2018-10-15 NOTE — Progress Notes (Signed)
Virtual Visit via Telephone Note Due to current restrictions/limitations of in-office visits due to the COVID-19 pandemic, this scheduled clinical appointment was converted to a telehealth visit  I connected with Victoria Miller on 10/15/18 at 11:08 a.m by telephone and verified that I am speaking with the correct person using two identifiers. I am in my office.  The patient is at home.  Only the patient and myself participated in this encounter.  I discussed the limitations, risks, security and privacy concerns of performing an evaluation and management service by telephone and the availability of in person appointments. I also discussed with the patient that there may be a patient responsible charge related to this service. The patient expressed understanding and agreed to proceed.   History of Present Illness: Pt with hx of hypothyroid  Needing RF on Levothyroxine. Took last dose today.  More tired and cold feeling.  Would like thyroid level check.    She is due for Tdap and flu vaccine.  Patient would like to come in as a nurse only visit to have those done.   Outpatient Encounter Medications as of 10/15/2018  Medication Sig  . levothyroxine (SYNTHROID, LEVOTHROID) 100 MCG tablet Take 1 tablet (100 mcg total) by mouth daily before breakfast.  . Norethindrone Acetate-Ethinyl Estrad-FE (LOESTRIN 24 FE) 1-20 MG-MCG(24) tablet Take 1 tablet by mouth daily. (Patient not taking: Reported on 10/15/2018)  . [DISCONTINUED] metroNIDAZOLE (FLAGYL) 500 MG tablet Take 1 tablet (500 mg total) by mouth 2 (two) times daily. (Patient not taking: Reported on 01/28/2018)  . [DISCONTINUED] naproxen (NAPROSYN) 375 MG tablet Take 1 tablet (375 mg total) by mouth 2 (two) times daily. (Patient not taking: Reported on 01/28/2018)   No facility-administered encounter medications on file as of 10/15/2018.     Observations/Objective: No direct observation done as this was a telephone encounter  Assessment and Plan: 1.  Acquired hypothyroidism - levothyroxine (SYNTHROID) 100 MCG tablet; Take 1 tablet (100 mcg total) by mouth daily before breakfast.  Dispense: 30 tablet; Refill: 2 - TSH; Future  2. Need for Tdap vaccination 3. Need for influenza vaccination It has been scheduled for her to have flu and Tdap vaccine when she comes to the lab next week to have her thyroid level checked.   Follow Up Instructions: PRN   I discussed the assessment and treatment plan with the patient. The patient was provided an opportunity to ask questions and all were answered. The patient agreed with the plan and demonstrated an understanding of the instructions.   The patient was advised to call back or seek an in-person evaluation if the symptoms worsen or if the condition fails to improve as anticipated.  I provided  4 minutes of non-face-to-face time during this encounter.   Karle Plumber, MD

## 2018-10-18 ENCOUNTER — Ambulatory Visit: Payer: Medicaid Other

## 2018-10-18 ENCOUNTER — Other Ambulatory Visit: Payer: Self-pay

## 2018-10-18 ENCOUNTER — Ambulatory Visit: Payer: Medicaid Other | Attending: Family Medicine | Admitting: Pharmacist

## 2018-10-18 DIAGNOSIS — E039 Hypothyroidism, unspecified: Secondary | ICD-10-CM | POA: Diagnosis not present

## 2018-10-19 ENCOUNTER — Other Ambulatory Visit: Payer: Self-pay | Admitting: Internal Medicine

## 2018-10-19 DIAGNOSIS — E039 Hypothyroidism, unspecified: Secondary | ICD-10-CM

## 2018-10-19 LAB — TSH: TSH: 5.09 u[IU]/mL — ABNORMAL HIGH (ref 0.450–4.500)

## 2018-10-19 MED ORDER — LEVOTHYROXINE SODIUM 112 MCG PO TABS: 112.0000 ug | ORAL_TABLET | Freq: Every day | ORAL | 5 refills | Status: DC

## 2018-12-10 ENCOUNTER — Emergency Department (HOSPITAL_COMMUNITY)
Admission: EM | Admit: 2018-12-10 | Discharge: 2018-12-11 | Disposition: A | Discharge status: Home / Self Care | Payer: Medicaid Other | Attending: Emergency Medicine | Admitting: Emergency Medicine

## 2018-12-10 ENCOUNTER — Other Ambulatory Visit: Payer: Self-pay

## 2018-12-10 DIAGNOSIS — Z041 Encounter for examination and observation following transport accident: Secondary | ICD-10-CM | POA: Insufficient documentation

## 2018-12-10 DIAGNOSIS — Z5321 Procedure and treatment not carried out due to patient leaving prior to being seen by health care provider: Secondary | ICD-10-CM | POA: Diagnosis not present

## 2018-12-10 NOTE — ED Notes (Signed)
Called for pt x5 to be roomed. No response.
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
## 2019-01-19 ENCOUNTER — Encounter: Payer: Self-pay | Admitting: General Practice

## 2019-07-24 ENCOUNTER — Other Ambulatory Visit: Payer: Self-pay

## 2019-07-24 ENCOUNTER — Emergency Department (HOSPITAL_COMMUNITY)
Admission: EM | Admit: 2019-07-24 | Discharge: 2019-07-24 | Disposition: A | Discharge status: Home / Self Care | Payer: Medicaid Other | Attending: Emergency Medicine | Admitting: Emergency Medicine

## 2019-07-24 ENCOUNTER — Encounter (HOSPITAL_COMMUNITY): Payer: Self-pay | Admitting: Emergency Medicine

## 2019-07-24 DIAGNOSIS — N898 Other specified noninflammatory disorders of vagina: Secondary | ICD-10-CM | POA: Diagnosis not present

## 2019-07-24 DIAGNOSIS — R300 Dysuria: Secondary | ICD-10-CM | POA: Diagnosis present

## 2019-07-24 LAB — WET PREP, GENITAL
Clue Cells Wet Prep HPF POC: NONE SEEN
Sperm: NONE SEEN
Trich, Wet Prep: NONE SEEN
Yeast Wet Prep HPF POC: NONE SEEN

## 2019-07-24 LAB — URINALYSIS, ROUTINE W REFLEX MICROSCOPIC
Bilirubin Urine: NEGATIVE
Glucose, UA: NEGATIVE mg/dL
Hgb urine dipstick: NEGATIVE
Ketones, ur: NEGATIVE mg/dL
Nitrite: NEGATIVE
Protein, ur: NEGATIVE mg/dL
Specific Gravity, Urine: 1.017 (ref 1.005–1.030)
pH: 6 (ref 5.0–8.0)

## 2019-07-24 LAB — PREGNANCY, URINE: Preg Test, Ur: NEGATIVE

## 2019-07-24 MED ORDER — METRONIDAZOLE 500 MG PO TABS: 500.0000 mg | ORAL_TABLET | Freq: Two times a day (BID) | ORAL | 0 refills | Status: DC

## 2019-07-24 NOTE — ED Provider Notes (Signed)
am [facility] EMERGENCY DEPARTMENT Provider Note   CSN: [id number] Arrival date & time: 07/24/19  6378     History No chief complaint on file.   Victoria Miller is a 24 y.o. female with PMH significant for bacterial vaginosis who presents the ED with a 1-2 week history of vaginal discomfort.  Patient also notes mild dysuria, stating that it just feels "different".  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  She states that she is prone to BV for whatever reason.  She states that she switched soaps of couple of weeks ago which she believes precipitated her symptoms of BV.  While she is endorsing mild pelvic discomfort, she also admits that she just had her menses which concluded a couple of days ago.  Her vaginal discharge is slightly darker than usual.  She has not been sexually active recently, but would like GC testing.  She denies any abdominal pain, nausea or vomiting, pain with defecation, vaginal itching, changes in bowel habits, fevers or chills, or other symptoms.  HPI     Past Medical History:  Diagnosis Date  . Sickle cell anemia (HCC)    Trait  . Thyroid disease     Patient Active Problem List   Diagnosis Date Noted  . Sickle cell trait (HCC) 01/08/2018  . Hypothyroidism 01/08/2018  . Ruptured ovarian cyst 01/08/2018  . Bacterial vaginosis 01/08/2018    History reviewed. No pertinent surgical history.   OB History    Gravida  1   Para  1   Term  1   Preterm      AB      Living  1     SAB      TAB      Ectopic      Multiple      Live Births  1           Family History  Problem Relation Age of Onset  . Heart disease Mother   . Arthritis Mother     Social History   Tobacco Use  . Smoking status: Never Smoker  . Smokeless tobacco: Never Used  Vaping Use  . Vaping Use: Never used  Substance Use Topics  . Alcohol use: Yes  . Drug use: Not Currently    Home Medications Prior to  Admission medications   Medication Sig Start Date End Date Taking? Authorizing Provider  levothyroxine (SYNTHROID) 112 MCG tablet Take 1 tablet (112 mcg total) by mouth daily before breakfast. 10/19/18   Marcine Matar, MD  metroNIDAZOLE (FLAGYL) 500 MG tablet Take 1 tablet (500 mg total) by mouth 2 (two) times daily. 07/24/19   Lorelee New, PA-C  Norethindrone Acetate-Ethinyl Estrad-FE (LOESTRIN 24 FE) 1-20 MG-MCG(24) tablet Take 1 tablet by mouth daily. Patient not taking: Reported on 10/15/2018 07/22/18   Nugent, Odie Sera, NP    Allergies    Patient has no known allergies.  Review of Systems   Review of Systems  Constitutional: Negative for fever.  Gastrointestinal: Negative for abdominal pain, blood in stool and nausea.  Genitourinary: Positive for dysuria, vaginal discharge and vaginal pain. Negative for dyspareunia, flank pain, frequency, genital sores, pelvic pain and vaginal bleeding.  Skin: Negative for rash and wound.  Neurological: Negative for numbness.    Physical Exam Updated Vital Signs BP 115/67 (BP Location: Left Arm)   Pulse 72   Temp 98.2 F (36.8 C) (Oral)   Resp 20  SpO2 100%   Physical Exam Vitals and nursing note reviewed. Exam conducted with a chaperone present.  Constitutional:      Appearance: Normal appearance.  HENT:     Head: Normocephalic and atraumatic.  Eyes:     General: No scleral icterus.    Conjunctiva/sclera: Conjunctivae normal.  Cardiovascular:     Rate and Rhythm: Normal rate and regular rhythm.  Pulmonary:     Effort: Pulmonary effort is normal. No respiratory distress.     Breath sounds: Normal breath sounds.  Abdominal:     Comments: Soft, nondistended.  No areas of tenderness.  No overlying skin changes.  NABS.  Genitourinary:    Comments: Normal external genitalia. Speculum exam: Moderate amount of whitish discharge in vaginal vault.  Normal cervix.  Mild odor. Bimanual exam: No adnexal or cervical motion tenderness  or masses appreciated. Skin:    General: Skin is dry.  Neurological:     Mental Status: She is alert.     GCS: GCS eye subscore is 4. GCS verbal subscore is 5. GCS motor subscore is 6.  Psychiatric:        Mood and Affect: Mood normal.        Behavior: Behavior normal.        Thought Content: Thought content normal.     ED Results / Procedures / Treatments   Labs (all labs ordered are listed, but only abnormal results are displayed) Labs Reviewed  WET PREP, GENITAL - Abnormal; Notable for the following components:      Result Value   WBC, Wet Prep HPF POC MANY (*)    All other components within normal limits  URINALYSIS, ROUTINE W REFLEX MICROSCOPIC - Abnormal; Notable for the following components:   APPearance HAZY (*)    Leukocytes,Ua SMALL (*)    Bacteria, UA RARE (*)    All other components within normal limits  URINE CULTURE  PREGNANCY, URINE  GC/CHLAMYDIA PROBE AMP ([place]) NOT AT [facility]    EKG None  Radiology No results found.  Procedures Procedures (including critical care time)  Medications Ordered in ED Medications - No data to display  ED Course  I have reviewed the triage vital signs and the nursing notes.  Pertinent labs & imaging results that were available during my care of the patient were reviewed by me and considered in my medical decision making (see chart for details).    MDM Rules/Calculators/A&P                          We will obtain GC testing, however patient has low suspicion at this time.  She states that she has not been sexually active recently and is more concerned with her studies.  She states that she is prone to BV infections and that this feels similar to her prior episodes.  She also states that she recently changed her soaps which she believes precipitated her current symptoms.  No CMT or adnexal tenderness on exam.  Low suspicion for PID.  Abdomen is soft, nontender.  WBC noted, but no clue cells.  Regardless, given her  history and physical exam, will prescribe metronidazole as that has helped with her episodes in the past.  Recommending outpatient follow-up for continued evaluation and management.  She was negative for urine pregnancy and denies any significant pain symptoms otherwise concerning for torsion.  All of the evaluation and work-up results were discussed with the patient and any family at  bedside.  Patient and/or family were informed that while patient is appropriate for discharge at this time, some medical emergencies may only develop or become detectable after a period of time.  I specifically instructed patient and/or family to return to return to the ED or seek immediate medical attention for any new or worsening symptoms.  They were provided opportunity to ask any additional questions and have none at this time.  Prior to discharge patient is feeling well, agreeable with plan for discharge home.  They have expressed understanding of verbal discharge instructions as well as return precautions and are agreeable to the plan.    Final Clinical Impression(s) / ED Diagnoses Final diagnoses:  Vaginal discharge    Rx / DC Orders ED Discharge Orders         Ordered    metroNIDAZOLE (FLAGYL) 500 MG tablet  2 times daily     Discontinue  Reprint     07/24/19 1039           Lorelee New, PA-C 07/24/19 1039    Eber Hong, MD 07/24/19 1513

## 2019-07-24 NOTE — ED Triage Notes (Signed)
Pt here from home with c/o vaginal discomfort over the last week hx of BV, pt also reports some burring upon urination and frequency

## 2019-07-24 NOTE — Discharge Instructions (Signed)
Please follow-up with your primary care provider as well as OB/GYN for ongoing evaluation and management of your pelvic symptoms.  Your physical exam is reassuring.  Laboratory work-up also unremarkable.  Please check your MyChart to determine whether or not your GC testing was positive.  If so, you will need to be treated with the appropriate antibiotics.  While your wet prep was negative for clue cells concerning for bacterial vaginosis, based on her history and symptoms, feel as though it is reasonable to treat with metronidazole.  Do not combine with alcohol.  Take as directed.  Return to the ED or seek immediate medical attention should you experience any new or worsening symptoms.

## 2019-07-25 LAB — GC/CHLAMYDIA PROBE AMP (~~LOC~~) NOT AT ARMC
Chlamydia: NEGATIVE
Comment: NEGATIVE
Comment: NORMAL
Neisseria Gonorrhea: NEGATIVE

## 2019-07-25 LAB — URINE CULTURE: Culture: NO GROWTH

## 2019-07-26 ENCOUNTER — Telehealth: Payer: Self-pay | Admitting: *Deleted

## 2019-07-26 DIAGNOSIS — N760 Acute vaginitis: Secondary | ICD-10-CM

## 2019-07-26 NOTE — Telephone Encounter (Signed)
Attempted to contact pt to complete transition of care assessment; left message on voicemail.  Katrice Kresha Abelson, RN, BSN, CCRN [facility] [phone]  

## 2019-07-26 NOTE — Addendum Note (Signed)
Addended by: Redmond Baseman on: 07/26/2019 09:39 AM   Modules accepted: Orders

## 2019-07-26 NOTE — Telephone Encounter (Signed)
Pt called back  Transition Care Management Follow-up Telephone Call   [facility] Managed Care Transition Call Status:MM [facility] Call Made   Date of discharge and from where: [name], 07/24/19   How have you been since you were released from the hospital? Much better   Any questions or concerns? No  Items Reviewed:  Did the pt receive and understand the discharge instructions provided? Yes   Medications obtained and verified? Yes   Any new allergies since your discharge? No   Dietary orders reviewed? yes  Do you have support at home? yes  Functional Questionnaire: (I = Independent and D = Dependent)  ADLs: Independent Bathing/Dressing:Independent Meal Prep: Independent Eating: Independent Maintaining continence: Independent Transferring/Ambulation: Independent Managing Meds: Independent Follow up appointments reviewed:  PCP Hospital f/u appt confirmed? No  pt states she sent MyChart Message to [facility] to schedule appt  Specialist Hospital f/u appt confirmed? N/a  Are transportation arrangements needed? No   If their condition worsens, is the pt aware to call PCP or go to the EmergencyDept.? Yes  Was the patient provided with contact information for the PCP's office or ED? {yes  Was to pt encouraged to call back with questions or concerns? yes  Order placed for [facility] Coordination since pt needs PCP  Burnard Bunting, RN, BSN, CCRN [facility] [phone]

## 2019-07-27 ENCOUNTER — Telehealth: Payer: Self-pay | Admitting: *Deleted

## 2019-07-27 NOTE — Telephone Encounter (Signed)
Called patient to verify if she is still a patient of Dr Laural Benes ,and to schedule a follow up appointment. I left a message and call back number for patient to call back.

## 2019-07-27 NOTE — Telephone Encounter (Signed)
Call received from [facility] in response to [facility] general discharge transition call. Patient states she has follow up visits scheduled ,and she is also still a patient of Dr Henriette Combs.

## 2019-08-01 ENCOUNTER — Emergency Department (HOSPITAL_COMMUNITY): Payer: Medicaid Other

## 2019-08-01 ENCOUNTER — Encounter (HOSPITAL_COMMUNITY): Payer: Self-pay | Admitting: Emergency Medicine

## 2019-08-01 ENCOUNTER — Emergency Department (HOSPITAL_COMMUNITY)
Admission: EM | Admit: 2019-08-01 | Discharge: 2019-08-01 | Disposition: A | Discharge status: Home / Self Care | Payer: Medicaid Other | Attending: Emergency Medicine | Admitting: Emergency Medicine

## 2019-08-01 ENCOUNTER — Other Ambulatory Visit: Payer: Self-pay

## 2019-08-01 DIAGNOSIS — Y999 Unspecified external cause status: Secondary | ICD-10-CM | POA: Diagnosis not present

## 2019-08-01 DIAGNOSIS — Y92410 Unspecified street and highway as the place of occurrence of the external cause: Secondary | ICD-10-CM | POA: Insufficient documentation

## 2019-08-01 DIAGNOSIS — Y939 Activity, unspecified: Secondary | ICD-10-CM | POA: Diagnosis not present

## 2019-08-01 DIAGNOSIS — R109 Unspecified abdominal pain: Secondary | ICD-10-CM | POA: Insufficient documentation

## 2019-08-01 DIAGNOSIS — M545 Low back pain: Secondary | ICD-10-CM | POA: Diagnosis not present

## 2019-08-01 LAB — URINALYSIS, ROUTINE W REFLEX MICROSCOPIC
Bilirubin Urine: NEGATIVE
Glucose, UA: NEGATIVE mg/dL
Hgb urine dipstick: NEGATIVE
Ketones, ur: NEGATIVE mg/dL
Leukocytes,Ua: NEGATIVE
Nitrite: NEGATIVE
Protein, ur: NEGATIVE mg/dL
Specific Gravity, Urine: 1.014 (ref 1.005–1.030)
pH: 7 (ref 5.0–8.0)

## 2019-08-01 LAB — PREGNANCY, URINE: Preg Test, Ur: NEGATIVE

## 2019-08-01 MED ORDER — IBUPROFEN 400 MG PO TABS
400.0000 mg | ORAL_TABLET | Freq: Once | ORAL | Status: AC
  Administered 2019-08-01: 400 mg via ORAL
  Filled 2019-08-01: qty 1
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
## 2019-08-02 ENCOUNTER — Other Ambulatory Visit: Payer: Self-pay

## 2019-08-02 NOTE — Patient Outreach (Signed)
[facility]) Care Management  08/02/2019  Victoria Miller 1996-01-05 [id number]   New referral for Managed Medicaid:  Placed call to patient and reviewed reason for call.  Crying baby in the background made it difficult for patient to hear. Offered to call her back tomorrow when she was able to talk better and she agreed.   PLAN: will call back tomorrow to complete assessment.  Rowe Pavy, RN, BSN, CEN [facility] [employer] [phone]

## 2019-08-03 ENCOUNTER — Other Ambulatory Visit: Payer: Self-pay

## 2019-08-03 DIAGNOSIS — S39011A Strain of muscle, fascia and tendon of abdomen, initial encounter: Secondary | ICD-10-CM | POA: Diagnosis not present

## 2019-08-03 DIAGNOSIS — G8911 Acute pain due to trauma: Secondary | ICD-10-CM | POA: Diagnosis not present

## 2019-08-03 NOTE — Patient Outreach (Signed)
[facility]) Care Management  08/03/2019  Nevena Rozenberg Feb 02, 1995 [id number]   Managed Medicaid:  Placed call to patient today as requested by patient. No answer. Left a message requesting a call back.  PLAN: will attempt follow up via phone in 3 business days.  Rowe Pavy, RN, BSN, CEN [facility] [employer] [phone]

## 2019-08-08 ENCOUNTER — Other Ambulatory Visit: Payer: Self-pay

## 2019-08-08 NOTE — Patient Outreach (Signed)
Care Coordination  08/08/2019  Kaelee Pfeffer [birth year] [id number]    Managed Medicaid: Placed call to patient on 08/02/2019:  Answered the phone and could not talk.                                         08/03/2019   Called again no answer                                         08/08/2019.  Called again no answer. Mailed unsuccessful outreach letter.   PLAN: will call back in 3 days.  Rowe Pavy, RN, BSN, CEN [facility] [employer] [phone]

## 2019-08-11 ENCOUNTER — Other Ambulatory Visit: Payer: Self-pay | Admitting: Internal Medicine

## 2019-08-11 ENCOUNTER — Ambulatory Visit: Payer: Medicaid Other | Admitting: Physician Assistant

## 2019-08-11 ENCOUNTER — Other Ambulatory Visit: Payer: Self-pay

## 2019-08-11 DIAGNOSIS — E039 Hypothyroidism, unspecified: Secondary | ICD-10-CM

## 2019-08-11 MED ORDER — LEVOTHYROXINE SODIUM 112 MCG PO TABS: 112.0000 ug | ORAL_TABLET | Freq: Every day | ORAL | 0 refills | Status: DC

## 2019-08-11 NOTE — Telephone Encounter (Signed)
levothyroxine (SYNTHROID) 112 MCG tablet    Patient is requesting refill.    Pharmacy:  [id number] - [place], [place] - [address] AT [facility] [address] Phone:  [phone]  Fax:  [phone]

## 2019-08-11 NOTE — Telephone Encounter (Signed)
Appointment 09/01/19- RF per protocol #30

## 2019-08-12 NOTE — Patient Outreach (Signed)
[facility]) Care Management  08/12/2019  Leila Schuff Oct 17, 1995 [id number]   Telephone assessment:  Placed call to patient for assessment of needs. No answer.  PLAN: will outreach again in 3 days.  Rowe Pavy, RN, BSN, CEN [facility] [employer] [phone]

## 2019-08-22 ENCOUNTER — Other Ambulatory Visit: Payer: Self-pay

## 2019-08-22 NOTE — Patient Outreach (Addendum)
[facility]) Care Management  08/22/2019  Victoria Miller [birth year] [id number]    Case closure:  No response to letter or outreach calls.  PLAN: case closed  Rowe Pavy, RN, BSN, CEN [facility] [employer] [phone]

## 2019-09-01 ENCOUNTER — Ambulatory Visit: Payer: No Typology Code available for payment source | Attending: Physician Assistant | Admitting: Family

## 2019-09-01 ENCOUNTER — Encounter: Payer: Self-pay | Admitting: Family

## 2019-09-01 ENCOUNTER — Other Ambulatory Visit: Payer: Self-pay

## 2019-09-01 VITALS — BP 109/73 | HR 71 | Temp 99.2°F | Resp 16 | Wt 175.2 lb

## 2019-09-01 DIAGNOSIS — E039 Hypothyroidism, unspecified: Secondary | ICD-10-CM

## 2019-09-01 MED ORDER — LEVOTHYROXINE SODIUM 112 MCG PO TABS: 112.0000 ug | ORAL_TABLET | Freq: Every day | ORAL | 2 refills | Status: DC

## 2019-09-01 NOTE — Patient Instructions (Signed)
Continue Levothyroxine for hypothyroidism. Lab today. Follow-up in 3 with primary physician or sooner if needed. Hypothyroidism  Hypothyroidism is when the thyroid gland does not make enough of certain hormones (it is underactive). The thyroid gland is a small gland located in the lower front part of the neck, just in front of the windpipe (trachea). This gland makes hormones that help control how the body uses food for energy (metabolism) as well as how the heart and brain function. These hormones also play a role in keeping your bones strong. When the thyroid is underactive, it produces too little of the hormones thyroxine (T4) and triiodothyronine (T3). What are the causes? This condition may be caused by:  Hashimoto's disease. This is a disease in which the body's disease-fighting system (immune system) attacks the thyroid gland. This is the most common cause.  Viral infections.  Pregnancy.  Certain medicines.  Birth defects.  Past radiation treatments to the head or neck for cancer.  Past treatment with radioactive iodine.  Past exposure to radiation in the environment.  Past surgical removal of part or all of the thyroid.  Problems with a gland in the center of the brain (pituitary gland).  Lack of enough iodine in the diet. What increases the risk? You are more likely to develop this condition if:  You are female.  You have a family history of thyroid conditions.  You use a medicine called lithium.  You take medicines that affect the immune system (immunosuppressants). What are the signs or symptoms? Symptoms of this condition include:  Feeling as though you have no energy (lethargy).  Not being able to tolerate cold.  Weight gain that is not explained by a change in diet or exercise habits.  Lack of appetite.  Dry skin.  Coarse hair.  Menstrual irregularity.  Slowing of thought processes.  Constipation.  Sadness or depression. How is this  diagnosed? This condition may be diagnosed based on:  Your symptoms, your medical history, and a physical exam.  Blood tests. You may also have imaging tests, such as an ultrasound or MRI. How is this treated? This condition is treated with medicine that replaces the thyroid hormones that your body does not make. After you begin treatment, it may take several weeks for symptoms to go away. Follow these instructions at home:  Take over-the-counter and prescription medicines only as told by your health care provider.  If you start taking any new medicines, tell your health care provider.  Keep all follow-up visits as told by your health care provider. This is important. ? As your condition improves, your dosage of thyroid hormone medicine may change. ? You will need to have blood tests regularly so that your health care provider can monitor your condition. Contact a health care provider if:  Your symptoms do not get better with treatment.  You are taking thyroid replacement medicine and you: ? Sweat a lot. ? Have tremors. ? Feel anxious. ? Lose weight rapidly. ? Cannot tolerate heat. ? Have emotional swings. ? Have diarrhea. ? Feel weak. Get help right away if you have:  Chest pain.  An irregular heartbeat.  A rapid heartbeat.  Difficulty breathing. Summary  Hypothyroidism is when the thyroid gland does not make enough of certain hormones (it is underactive).  When the thyroid is underactive, it produces too little of the hormones thyroxine (T4) and triiodothyronine (T3).  The most common cause is Hashimoto's disease, a disease in which the body's disease-fighting system (immune system) attacks  the thyroid gland. The condition can also be caused by viral infections, medicine, pregnancy, or past radiation treatment to the head or neck.  Symptoms may include weight gain, dry skin, constipation, feeling as though you do not have energy, and not being able to tolerate  cold.  This condition is treated with medicine to replace the thyroid hormones that your body does not make. This information is not intended to replace advice given to you by your health care provider. Make sure you discuss any questions you have with your health care provider. Document Revised: 12/05/2016 Document Reviewed: 12/03/2016 Elsevier Patient Education  2020 [employer].

## 2019-09-01 NOTE — Progress Notes (Signed)
Patient ID: Victoria Miller, female    DOB: 06-25-1995  MRN: [id number]  CC: Hypothyroidism Follow-Up  Subjective: Charlsie Fleeger is a 24 y.o. female with history of hypothyroidism, ruptured ovarian cyst, bacterial vaginosis, and sickle cell trait who presents for hypothyroidism follow-up.  1. HYPOTHYROIDISM FOLLOW-UP: Thyroid control status:stable Satisfied with current treatment? yes Medication side effects: no Medication compliance: good compliance Etiology of hypothyroidism:  Recent dose adjustment:no Fatigue: yes Cold intolerance: yes Heat intolerance: no Weight gain: yes Weight loss: no Constipation: no Diarrhea/loose stools: no Palpitations: no Lower extremity edema: no   Last visit 10/15/2018 with Dr. Laural Benes. During that encounter continued on Levothyroxine. TSH in future.  Always tired Patient Active Problem List   Diagnosis Date Noted  . Sickle cell trait (HCC) 01/08/2018  . Hypothyroidism 01/08/2018  . Ruptured ovarian cyst 01/08/2018  . Bacterial vaginosis 01/08/2018     Current Outpatient Medications on File Prior to Visit  Medication Sig Dispense Refill  . levothyroxine (SYNTHROID) 112 MCG tablet Take 1 tablet (112 mcg total) by mouth daily before breakfast. 30 tablet 0  . metroNIDAZOLE (FLAGYL) 500 MG tablet Take 1 tablet (500 mg total) by mouth 2 (two) times daily. 14 tablet 0  . Norethindrone Acetate-Ethinyl Estrad-FE (LOESTRIN 24 FE) 1-20 MG-MCG(24) tablet Take 1 tablet by mouth daily. (Patient not taking: Reported on 10/15/2018) 1 Package 11   No current facility-administered medications on file prior to visit.    No Known Allergies  Social History   Socioeconomic History  . Marital status: Single    Spouse name: Not on file  . Number of children: Not on file  . Years of education: Not on file  . Highest education level: 12th grade  Occupational History  . Not on file  Tobacco Use  . Smoking status: Never Smoker  . Smokeless tobacco: Never  Used  Vaping Use  . Vaping Use: Never used  Substance and Sexual Activity  . Alcohol use: Yes  . Drug use: Not Currently  . Sexual activity: Yes    Birth control/protection: None    Comment: 1 partner  Other Topics Concern  . Not on file  Social History Narrative  . Not on file   Social Determinants of Health   Financial Resource Strain:   . Difficulty of Paying Living Expenses: Not on file  Food Insecurity:   . Worried About Programme researcher, broadcasting/film/video in the Last Year: Not on file  . Ran Out of Food in the Last Year: Not on file  Transportation Needs:   . Lack of Transportation (Medical): Not on file  . Lack of Transportation (Non-Medical): Not on file  Physical Activity:   . Days of Exercise per Week: Not on file  . Minutes of Exercise per Session: Not on file  Stress:   . Feeling of Stress : Not on file  Social Connections:   . Frequency of Communication with Friends and Family: Not on file  . Frequency of Social Gatherings with Friends and Family: Not on file  . Attends Religious Services: Not on file  . Active Member of Clubs or Organizations: Not on file  . Attends Banker Meetings: Not on file  . Marital Status: Not on file  Intimate Partner Violence:   . Fear of Current or Ex-Partner: Not on file  . Emotionally Abused: Not on file  . Physically Abused: Not on file  . Sexually Abused: Not on file    Family History  Problem Relation  Age of Onset  . Heart disease Mother   . Arthritis Mother     No past surgical history on file.  ROS: Review of Systems Negative except as stated above  Vitals with BMI 09/01/2019 08/01/2019 08/01/2019  Height - - -  Weight 175 lbs 3 oz - -  BMI - - -  Systolic 109 116 824  Diastolic 73 58 87  Pulse 71 83 83   Wt Readings from Last 3 Encounters:  09/01/19 175 lb 3.2 oz (79.5 kg)  12/10/18 150 lb (68 kg)  07/27/18 154 lb 9.6 oz (70.1 kg)   PHYSICAL EXAM: General appearance - alert, well appearing, and in no  distress and oriented to person, place, and time Mental status - alert, oriented to person, place, and time, normal mood, behavior, speech, dress, motor activity, and thought processes Neck - supple, no significant adenopathy Lymphatics - no palpable lymphadenopathy, no hepatosplenomegaly Chest - clear to auscultation, no wheezes, rales or rhonchi, symmetric air entry, no tachypnea, retractions or cyanosis Heart - normal rate, regular rhythm, normal S1, S2, no murmurs, rubs, clicks or gallops Neurological - alert, oriented, normal speech, no focal findings or movement disorder noted, neck supple without rigidity, cranial nerves II through XII intact, motor and sensory grossly normal bilaterally, normal muscle tone, no tremors, strength 5/5, Romberg sign negative, normal gait and station  ASSESSMENT AND PLAN: 1. Acquired hypothyroidism: - Continue Levothyroxine as prescribed for hypothyroidism.  - TSH today to check thyroid function.  - Follow-up with primary physician in 3 months or sooner if needed. - TSH - levothyroxine (SYNTHROID) 112 MCG tablet; Take 1 tablet (112 mcg total) by mouth daily before breakfast.  Dispense: 30 tablet; Refill: 2   Patient was given the opportunity to ask questions.  Patient verbalized understanding of the plan and was able to repeat key elements of the plan. Patient was given clear instructions to go to Emergency Department or return to medical center if symptoms don't improve, worsen, or new problems develop.The patient verbalized understanding.  Rema Fendt, NP

## 2019-09-02 LAB — TSH: TSH: 7.32 u[IU]/mL — ABNORMAL HIGH (ref 0.450–4.500)

## 2019-09-05 ENCOUNTER — Telehealth: Payer: Self-pay

## 2019-09-05 MED ORDER — LEVOTHYROXINE SODIUM 125 MCG PO TABS: 125.0000 ug | ORAL_TABLET | Freq: Every day | ORAL | 1 refills | Status: DC

## 2019-09-05 NOTE — Telephone Encounter (Signed)
Contacted pt to go over lab results pt didn't answer lvm asking pt to give a call back at her earliest convenience  

## 2019-09-05 NOTE — Addendum Note (Signed)
Addended by: Rema Fendt on: 09/05/2019 07:39 AM   Modules accepted: Orders

## 2019-09-05 NOTE — Progress Notes (Signed)
Please call patient with update.   TSH higher than normal.   Increase Levothyroxine to 125 mcg/daily. Prescription sent to pharmacy on file.   Return in 4 to 6 weeks for repeat TSH blood collection.

## 2019-11-17 ENCOUNTER — Ambulatory Visit: Payer: Medicaid Other | Admitting: Internal Medicine

## 2019-12-12 ENCOUNTER — Inpatient Hospital Stay (HOSPITAL_COMMUNITY)
Admission: AD | Admit: 2019-12-12 | Discharge: 2019-12-12 | Disposition: A | Discharge status: Home / Self Care | Payer: Medicaid Other | Attending: Family Medicine | Admitting: Family Medicine

## 2019-12-12 ENCOUNTER — Other Ambulatory Visit: Payer: Self-pay

## 2019-12-12 ENCOUNTER — Encounter (HOSPITAL_COMMUNITY): Payer: Self-pay | Admitting: Obstetrics and Gynecology

## 2019-12-12 DIAGNOSIS — Z202 Contact with and (suspected) exposure to infections with a predominantly sexual mode of transmission: Secondary | ICD-10-CM | POA: Diagnosis not present

## 2019-12-12 DIAGNOSIS — N898 Other specified noninflammatory disorders of vagina: Secondary | ICD-10-CM | POA: Insufficient documentation

## 2019-12-12 NOTE — MAU Note (Signed)
Is having discomfort down there, burning and itching in vagina.  Is prone to BV.  Switched soap yesterday, started after that.   Did want to wait.

## 2019-12-12 NOTE — MAU Provider Note (Signed)
First Provider Initiated Contact with Patient 12/12/19 1407      S [name] is a 24 y.o. G1P1001 patient who presents to MAU today with complaint of burning and itching in vaginal area. She reports she switched soaps yesterday and the symptoms started after that. She denies any vaginal odor, but "knows it is BV, because I'm prone to BV." She reports she called her GYN office ([facility]) this morning around 0900, but "ws put on hold for too long."   O BP 124/64 (BP Location: Right Arm)   Pulse 80   Temp 98.5 F (36.9 C) (Oral)   Resp 16   Wt 82.5 kg   LMP 12/04/2019   SpO2 99%   BMI 24.66 kg/m  Physical Exam Vitals and nursing note reviewed.  Constitutional:      Appearance: Normal appearance. She is normal weight.  HENT:     Head: Normocephalic and atraumatic.  Cardiovascular:     Rate and Rhythm: Normal rate.     Pulses: Normal pulses.  Pulmonary:     Effort: Pulmonary effort is normal.  Genitourinary:    Comments: Not examined Musculoskeletal:        General: Normal range of motion.     Cervical back: Normal range of motion.  Neurological:     Mental Status: She is alert and oriented to person, place, and time.  Psychiatric:        Mood and Affect: Mood normal.        Behavior: Behavior normal.        Thought Content: Thought content normal.        Judgment: Judgment normal.     A Medical screening exam complete Vaginal irritation   P Discharge from MAU in stable condition Patient given the option to call her GYN office ([facility]) for further evaluation or seek care in outpatient facility of choice  Message sent to [facility] to get patient scheduled for RN visit. Warning signs for worsening condition that would warrant emergency follow-up discussed Patient may return to MAU as needed for pregnancy   Raelyn Mora, CNM 12/12/2019 2:07 PM

## 2019-12-14 ENCOUNTER — Ambulatory Visit: Payer: Medicaid Other

## 2020-01-09 ENCOUNTER — Encounter: Payer: Self-pay | Admitting: Internal Medicine

## 2020-01-09 ENCOUNTER — Encounter: Payer: Self-pay | Admitting: Family

## 2020-01-16 ENCOUNTER — Other Ambulatory Visit: Payer: Self-pay

## 2020-01-16 ENCOUNTER — Ambulatory Visit (INDEPENDENT_AMBULATORY_CARE_PROVIDER_SITE_OTHER): Payer: Medicaid Other | Admitting: *Deleted

## 2020-01-16 VITALS — BP 136/75 | HR 99 | Temp 98.1°F | Wt 180.6 lb

## 2020-01-16 DIAGNOSIS — Z3201 Encounter for pregnancy test, result positive: Secondary | ICD-10-CM

## 2020-01-16 DIAGNOSIS — Z3480 Encounter for supervision of other normal pregnancy, unspecified trimester: Secondary | ICD-10-CM | POA: Insufficient documentation

## 2020-01-16 LAB — POCT URINE PREGNANCY: Preg Test, Ur: POSITIVE — AB

## 2020-01-16 MED ORDER — GOJJI WEIGHT SCALE MISC: 1.0000 | Freq: Every day | 0 refills | Status: DC | PRN

## 2020-01-16 MED ORDER — BLOOD PRESSURE MONITOR AUTOMAT DEVI: 1.0000 | Freq: Every day | 0 refills | Status: DC

## 2020-01-16 MED ORDER — PRENATE PIXIE 10-0.6-0.4-200 MG PO CAPS: 1.0000 | ORAL_CAPSULE | Freq: Every day | ORAL | 0 refills | Status: AC

## 2020-01-16 NOTE — Progress Notes (Signed)
   PRENATAL INTAKE SUMMARY  Ms. Pals presents today New OB Nurse Interview.  OB History    Gravida  2   Para  1   Term  1   Preterm      AB      Living  1     SAB      IAB      Ectopic      Multiple      Live Births  1          I have reviewed the patient's medical, obstetrical, social, and family histories, medications, and available lab results.  SUBJECTIVE She has no unusual complaints  OBJECTIVE Initial nurse interview for history (New OB)  GENERAL APPEARANCE: alert, well appearing, in no apparent distress, oriented to person, place and time   ASSESSMENT Positive UPT Normal pregnancy  PLAN Prenatal care Labs to be completed at next visit with Raelyn Mora, CNM 02/22/20 Ultrasound <14 weeks scheduled for 01/23/20 for confirmation and viability Sample of Prenate Pixie #9 capsules Rx for BP monitor and weight scale sent to [facility].  Clovis Pu, RN

## 2020-01-23 ENCOUNTER — Ambulatory Visit: Payer: Medicaid Other

## 2020-02-22 ENCOUNTER — Encounter: Payer: Medicaid Other | Admitting: Obstetrics and Gynecology

## 2020-02-23 ENCOUNTER — Other Ambulatory Visit: Payer: Self-pay | Admitting: Family

## 2020-02-23 DIAGNOSIS — E039 Hypothyroidism, unspecified: Secondary | ICD-10-CM

## 2020-02-23 MED ORDER — LEVOTHYROXINE SODIUM 125 MCG PO TABS: 125.0000 ug | ORAL_TABLET | Freq: Every day | ORAL | 1 refills | Status: DC

## 2020-02-29 ENCOUNTER — Encounter (HOSPITAL_COMMUNITY): Payer: Self-pay | Admitting: Emergency Medicine

## 2020-02-29 ENCOUNTER — Emergency Department (HOSPITAL_COMMUNITY)
Admission: EM | Admit: 2020-02-29 | Discharge: 2020-02-29 | Disposition: A | Discharge status: Home / Self Care | Payer: Medicaid Other | Attending: Emergency Medicine | Admitting: Emergency Medicine

## 2020-02-29 DIAGNOSIS — Z5321 Procedure and treatment not carried out due to patient leaving prior to being seen by health care provider: Secondary | ICD-10-CM | POA: Diagnosis not present

## 2020-02-29 DIAGNOSIS — N938 Other specified abnormal uterine and vaginal bleeding: Secondary | ICD-10-CM | POA: Diagnosis not present

## 2020-02-29 DIAGNOSIS — N3001 Acute cystitis with hematuria: Secondary | ICD-10-CM | POA: Diagnosis not present

## 2020-02-29 DIAGNOSIS — N939 Abnormal uterine and vaginal bleeding, unspecified: Secondary | ICD-10-CM | POA: Diagnosis present

## 2020-02-29 DIAGNOSIS — N83202 Unspecified ovarian cyst, left side: Secondary | ICD-10-CM | POA: Diagnosis not present

## 2020-02-29 LAB — COMPREHENSIVE METABOLIC PANEL
ALT: 15 U/L (ref 0–44)
AST: 23 U/L (ref 15–41)
Albumin: 3.9 g/dL (ref 3.5–5.0)
Alkaline Phosphatase: 38 U/L (ref 38–126)
Anion gap: 9 (ref 5–15)
BUN: 11 mg/dL (ref 6–20)
CO2: 26 mmol/L (ref 22–32)
Calcium: 9.4 mg/dL (ref 8.9–10.3)
Chloride: 103 mmol/L (ref 98–111)
Creatinine, Ser: 0.92 mg/dL (ref 0.44–1.00)
GFR, Estimated: >60 mL/min (ref >60)
Glucose, Bld: 89 mg/dL (ref 70–99)
Potassium: 4 mmol/L (ref 3.5–5.1)
Sodium: 138 mmol/L (ref 135–145)
Total Bilirubin: 0.7 mg/dL (ref 0.3–1.2)
Total Protein: 7.2 g/dL (ref 6.5–8.1)

## 2020-02-29 LAB — CBC
HCT: 33.9 % — ABNORMAL LOW (ref 36.0–46.0)
Hemoglobin: 11.9 g/dL — ABNORMAL LOW (ref 12.0–15.0)
MCH: 31.7 pg (ref 26.0–34.0)
MCHC: 35.1 g/dL (ref 30.0–36.0)
MCV: 90.4 fL (ref 80.0–100.0)
Platelets: 255 10*3/uL (ref 150–400)
RBC: 3.75 MIL/uL — ABNORMAL LOW (ref 3.87–5.11)
RDW: 13.1 % (ref 11.5–15.5)
WBC: 6.1 10*3/uL (ref 4.0–10.5)
nRBC: 0 % (ref 0.0–0.2)

## 2020-02-29 LAB — URINALYSIS, ROUTINE W REFLEX MICROSCOPIC
Bacteria, UA: NONE SEEN
Bilirubin Urine: NEGATIVE
Glucose, UA: NEGATIVE mg/dL
Ketones, ur: NEGATIVE mg/dL
Leukocytes,Ua: NEGATIVE
Nitrite: NEGATIVE
Protein, ur: NEGATIVE mg/dL
RBC / HPF: >50 RBC/hpf — ABNORMAL HIGH (ref 0–5)
Specific Gravity, Urine: 1.026 (ref 1.005–1.030)
pH: 6 (ref 5.0–8.0)

## 2020-02-29 LAB — I-STAT BETA HCG BLOOD, ED (MC, WL, AP ONLY): I-stat hCG, quantitative: <5 m[IU]/mL (ref <5)

## 2020-02-29 NOTE — ED Notes (Signed)
Called pt 3x for vitals, no response.  

## 2020-02-29 NOTE — ED Triage Notes (Signed)
Patient complains of heavy intermittent vaginal bleeding after an abortion in January 11. Patient denies pain, denies other symptoms.

## 2020-03-02 ENCOUNTER — Telehealth: Payer: Self-pay

## 2020-03-02 NOTE — Telephone Encounter (Signed)
Transition Care Management Follow-up Telephone Call  Date of discharge and from where: 03/01/2020 from [facility].   How have you been since you were released from the hospital? Pt states that she is feeling well since she has been home.   Any questions or concerns? No  Items Reviewed:  Did the pt receive and understand the discharge instructions provided? Yes   Medications obtained and verified? Yes   Other? No   Any new allergies since your discharge? No   Dietary orders reviewed? n/a  Do you have support at home? Yes  Functional Questionnaire: (I = Independent and D = Dependent) ADLs: I  Bathing/Dressing- I  Meal Prep- I  Eating- I  Maintaining continence- I  Transferring/Ambulation- I  Managing Meds- I   Follow up appointments reviewed:   PCP Hospital f/u appt confirmed? Yes  Scheduled to see Jonah Blue, MD on 04/17/2020 @ 03:50pm.  Specialist Hospital f/u appt confirmed? No   Are transportation arrangements needed? No   If their condition worsens, is the pt aware to call PCP or go to the Emergency Dept.? Yes  Was the patient provided with contact information for the PCP's office or ED? Yes  Was to pt encouraged to call back with questions or concerns? Yes

## 2020-04-13 DIAGNOSIS — B9689 Other specified bacterial agents as the cause of diseases classified elsewhere: Secondary | ICD-10-CM | POA: Diagnosis not present

## 2020-04-13 DIAGNOSIS — N760 Acute vaginitis: Secondary | ICD-10-CM | POA: Diagnosis not present

## 2020-04-16 ENCOUNTER — Telehealth: Payer: Self-pay

## 2020-04-16 NOTE — Telephone Encounter (Signed)
Transition Care Management Unsuccessful Follow-up Telephone Call  Date of discharge and from where:  04/13/2020 from [facility]  Attempts:  1st Attempt  Reason for unsuccessful TCM follow-up call:  Left voice message

## 2020-04-17 ENCOUNTER — Ambulatory Visit: Payer: Medicaid Other | Admitting: Internal Medicine

## 2020-04-17 NOTE — Telephone Encounter (Signed)
Transition Care Management Unsuccessful Follow-up Telephone Call  Date of discharge and from where:  04/13/2020 from [facility]  Attempts:  2nd Attempt  Reason for unsuccessful TCM follow-up call:  Unable to reach patient

## 2020-04-18 NOTE — Telephone Encounter (Signed)
Transition Care Management Follow-up Telephone Call  Date of discharge and from where: 04/13/2020 from [facility]  How have you been since you were released from the hospital? Pt answered the phone. I asked patient how she was doing and she stated fine and that she wished we would stop calling and then hung up the phone.   Any questions or concerns? No

## 2020-05-29 DIAGNOSIS — B373 Candidiasis of vulva and vagina: Secondary | ICD-10-CM | POA: Diagnosis not present

## 2020-05-29 DIAGNOSIS — L292 Pruritus vulvae: Secondary | ICD-10-CM | POA: Diagnosis not present

## 2020-05-30 ENCOUNTER — Telehealth: Payer: Self-pay | Admitting: *Deleted

## 2020-05-30 NOTE — Telephone Encounter (Signed)
Transition Care Management Follow-up Telephone Call  Date of discharge and from where: 05/29/2020 [facility] ED  How have you been since you were released from the hospital? "I am fine"  Any questions or concerns? No  Items Reviewed:  Did the pt receive and understand the discharge instructions provided? Yes   Medications obtained and verified? Yes   Other? No   Any new allergies since your discharge? No   Dietary orders reviewed? No  Do you have support at home? Yes    Functional Questionnaire: (I = Independent and D = Dependent) ADLs: I  Bathing/Dressing- I  Meal Prep- I  Eating- I  Maintaining continence- I  Transferring/Ambulation- I  Managing Meds- I  Follow up appointments reviewed:   PCP Hospital f/u appt confirmed? No    Specialist Hospital f/u appt confirmed? No    Are transportation arrangements needed? No   If their condition worsens, is the pt aware to call PCP or go to the Emergency Dept.? Yes  Was the patient provided with contact information for the PCP's office or ED? Yes  Was to pt encouraged to call back with questions or concerns? Yes

## 2020-06-09 DIAGNOSIS — O368911 Maternal care for other specified fetal problems, first trimester, fetus 1: Secondary | ICD-10-CM | POA: Diagnosis not present

## 2020-06-09 DIAGNOSIS — O26891 Other specified pregnancy related conditions, first trimester: Secondary | ICD-10-CM | POA: Diagnosis not present

## 2020-06-09 DIAGNOSIS — O4691 Antepartum hemorrhage, unspecified, first trimester: Secondary | ICD-10-CM | POA: Diagnosis not present

## 2020-06-09 DIAGNOSIS — O200 Threatened abortion: Secondary | ICD-10-CM | POA: Diagnosis not present

## 2020-06-09 DIAGNOSIS — O3481 Maternal care for other abnormalities of pelvic organs, first trimester: Secondary | ICD-10-CM | POA: Diagnosis not present

## 2020-06-09 DIAGNOSIS — Z3A01 Less than 8 weeks gestation of pregnancy: Secondary | ICD-10-CM | POA: Diagnosis not present

## 2020-06-11 ENCOUNTER — Telehealth: Payer: Self-pay | Admitting: *Deleted

## 2020-06-11 NOTE — Telephone Encounter (Signed)
Transition Care Management Unsuccessful Follow-up Telephone Call  Date of discharge and from where:  06/09/2020 - [facility]  Attempts:  1st Attempt  Reason for unsuccessful TCM follow-up call:  Voice mail full

## 2020-06-12 NOTE — Telephone Encounter (Signed)
Transition Care Management Unsuccessful Follow-up Telephone Call  Date of discharge and from where:  06/09/2020 - [facility]  Attempts:  2nd Attempt  Reason for unsuccessful TCM follow-up call:  Voice mail full

## 2020-06-13 NOTE — Telephone Encounter (Signed)
Transition Care Management Unsuccessful Follow-up Telephone Call  Date of discharge and from where:  06/09/2020 - [facility]  Attempts:  3rd Attempt  Reason for unsuccessful TCM follow-up call:  Voice mail full

## 2020-06-26 ENCOUNTER — Encounter: Payer: Self-pay | Admitting: General Practice

## 2020-06-26 ENCOUNTER — Ambulatory Visit (INDEPENDENT_AMBULATORY_CARE_PROVIDER_SITE_OTHER): Payer: Medicaid Other | Admitting: Advanced Practice Midwife

## 2020-06-26 ENCOUNTER — Encounter: Payer: Self-pay | Admitting: Advanced Practice Midwife

## 2020-06-26 ENCOUNTER — Other Ambulatory Visit: Payer: Self-pay

## 2020-06-26 ENCOUNTER — Other Ambulatory Visit (HOSPITAL_COMMUNITY)
Admission: RE | Admit: 2020-06-26 | Discharge: 2020-06-26 | Disposition: A | Discharge status: Home / Self Care | Payer: Medicaid Other | Source: Ambulatory Visit | Attending: Advanced Practice Midwife | Admitting: Advanced Practice Midwife

## 2020-06-26 VITALS — BP 118/68 | HR 70 | Wt 173.0 lb

## 2020-06-26 DIAGNOSIS — Z3480 Encounter for supervision of other normal pregnancy, unspecified trimester: Secondary | ICD-10-CM | POA: Insufficient documentation

## 2020-06-26 DIAGNOSIS — O99281 Endocrine, nutritional and metabolic diseases complicating pregnancy, first trimester: Secondary | ICD-10-CM

## 2020-06-26 DIAGNOSIS — Z3A09 9 weeks gestation of pregnancy: Secondary | ICD-10-CM | POA: Diagnosis not present

## 2020-06-26 DIAGNOSIS — E039 Hypothyroidism, unspecified: Secondary | ICD-10-CM

## 2020-06-26 MED ORDER — DOXYLAMINE-PYRIDOXINE 10-10 MG PO TBEC: 2.0000 | DELAYED_RELEASE_TABLET | Freq: Every evening | ORAL | 3 refills | Status: DC | PRN

## 2020-06-26 MED ORDER — LEVOTHYROXINE SODIUM 125 MCG PO TABS: 125.0000 ug | ORAL_TABLET | Freq: Every day | ORAL | 1 refills | Status: DC

## 2020-06-26 MED ORDER — ONDANSETRON 4 MG PO TBDP: 4.0000 mg | ORAL_TABLET | Freq: Four times a day (QID) | ORAL | 0 refills | Status: DC | PRN

## 2020-06-26 NOTE — Progress Notes (Signed)
INITIAL OBSTETRICAL VISIT Patient name: Victoria Miller MRN [id number]  Date of birth: 1995/07/08 Chief Complaint:   Initial Prenatal Visit  History of Present Illness:   Janilah Hojnacki is a 25 y.o. G27P1011 African American female at [redacted]w[redacted]d by LMP c/w u/s at [redacted]w[redacted]d weeks with an Estimated Date of Delivery: 01/28/21 being seen today for her initial obstetrical visit.   Her obstetrical history is significant for  Hypothyroidism, Sickle Cell Trait, hx SVD x 1 .   Today she reports nausea.  Depression screen [facility] 2/9 06/26/2020 01/16/2020 09/01/2019 01/08/2018  Decreased Interest 3 0 0 0  Down, Depressed, Hopeless 0 0 0 0  PHQ - 2 Score 3 0 0 0  Altered sleeping 0 - - -  Tired, decreased energy 3 - - -  Change in appetite 1 - - -  Feeling bad or failure about yourself  0 - - -  Trouble concentrating 0 - - -  Moving slowly or fidgety/restless 0 - - -  Suicidal thoughts 0 - - -  PHQ-9 Score 7 - - -    Patient's last menstrual period was 04/23/2020. Last pap unsure. Results were: normal Review of Systems:   Pertinent items are noted in HPI Denies cramping/contractions, leakage of fluid, vaginal bleeding, abnormal vaginal discharge w/ itching/odor/irritation, headaches, visual changes, shortness of breath, chest pain, abdominal pain, severe nausea/vomiting, or problems with urination or bowel movements unless otherwise stated above.  Pertinent History Reviewed:  Reviewed past medical,surgical, social, obstetrical and family history.  Reviewed problem list, medications and allergies. OB History  Gravida Para Term Preterm AB Living  3 1 1   1 1   SAB IAB Ectopic Multiple Live Births    1     1    # Outcome Date GA Lbr Len/2nd Weight Sex Delivery Anes PTL Lv  3 Current           2 IAB 01/17/20 [redacted]w[redacted]d         1 Term 06/14/17 [redacted]w[redacted]d  7 lb 12 oz (3.515 kg) F Vag-Spont  N LIV   Physical Assessment:   Vitals:   06/26/20 0932  BP: 118/68  Pulse: 70  Weight: 173 lb (78.5 kg)  Body mass index is 23.46  kg/m.       Physical Examination:  General appearance - well appearing, and in no distress  Mental status - alert, oriented to person, place, and time  Psych:  She has a normal mood and affect  Skin - warm and dry, normal color, no suspicious lesions noted  Chest - effort normal, all lung fields clear to auscultation bilaterally  Heart - normal rate and regular rhythm  Abdomen - soft, nontender  Extremities:  No swelling or varicosities noted  Pelvic - VULVA: normal appearing vulva with no masses, tenderness or lesions  VAGINA: normal appearing vagina with normal color and discharge, no lesions  CERVIX: normal appearing cervix without discharge or lesions, no CMT  Thin prep pap is done   Chaperone:  06/28/20      No results found for this or any previous visit (from the past 24 hour(s)).  Assessment & Plan:  1) Low-Risk Pregnancy G3P1011 at [redacted]w[redacted]d with an Estimated Date of Delivery: 01/28/21   2) Initial OB visit  3) Hypothyroidism  4) Sickle Cell Trait  Meds:  Meds ordered this encounter  Medications   levothyroxine (SYNTHROID) 125 MCG tablet    Sig: Take 1 tablet (125 mcg total) by mouth daily before breakfast.  Dispense:  30 tablet    Refill:  1    Order Specific Question:   Supervising Provider    Answer:   Levie Heritage [[id number]]   Doxylamine-Pyridoxine (DICLEGIS) 10-10 MG TBEC    Sig: Take 2 tablets by mouth at bedtime as needed (nausea).    Dispense:  60 tablet    Refill:  3    Take 2 tablets at bedtime. If symptoms persist, add 1 tab in the AM starting on day 3. If symptoms persist, add 1 tab in the PM starting day 4.    Order Specific Question:   Supervising Provider    Answer:   Levie Heritage [[id number]]   ondansetron (ZOFRAN ODT) 4 MG disintegrating tablet    Sig: Take 1 tablet (4 mg total) by mouth every 6 (six) hours as needed for nausea.    Dispense:  20 tablet    Refill:  0    Order Specific Question:   Supervising Provider    Answer:   Levie Heritage [[id number]]    Initial labs obtained Continue prenatal vitamins Reviewed n/v relief measures and warning s/s to report Reviewed recommended weight gain based on pre-gravid BMI Encouraged well-balanced diet Genetic & carrier screening discussed: requests Panorama, requests Panorama Ultrasound discussed; fetal survey: requested CCNC completed> form faxed if has or is planning to apply for medicaid The nature of [facility] [employer] with multiple MDs and other Advanced Practice Providers was explained to patient; also emphasized that fellows, residents, and students are part of our team.  Discussed nausea of pregnancy   Had Hyperemesis last preg, not as bad this time. Will start with diclegis and Zofran.  Will adjust PRN   Pt will call if not helping  Discussed contraception.  May consider IUD  Info given  Indications for ASA therapy (per uptodate) One of the following: H/O preeclampsia, especially early onset/adverse outcome No Multifetal gestation No CHTN No T1DM or T2DM No Chronic kidney disease No Autoimmune disease (antiphospholipid syndrome, systemic lupus erythematosus) No  OR Two or more of the following: Nulliparity No Obesity (BMI>30 kg/m2) No Family h/o preeclampsia in mother or sister No Age ?35 years No Sociodemographic characteristics (African American race, low socioeconomic level) Yes Personal risk factors (eg, previous pregnancy w/ LBW or SGA, previous adverse pregnancy outcome [eg, stillbirth], interval >10 years between pregnancies) No  Indications for early A1C (per uptodate) BMI >=25 (>=23 in Asian women) AND one of the following GDM in a previous pregnancy No Previous A1C?5.7, impaired glucose tolerance, or impaired fasting glucose on previous testing No First-degree relative with diabetes No High-risk race/ethnicity (eg, African American, Latino, Native American, Asian American, Pacific Islander) Yes History of cardiovascular  disease No HTN or on therapy for hypertension No HDL cholesterol level <35 mg/dL (2.72 mmol/L) and/or a triglyceride level >250 mg/dL (5.36 mmol/L) No PCOS No Physical inactivity No Other clinical condition associated with insulin resistance (eg, severe obesity, acanthosis nigricans) No Previous birth of an infant weighing ?4000 g No Previous stillbirth of unknown cause No >= 40yo No  Follow-up: Return in about 4 weeks (around 07/24/2020) for [employer].   Orders Placed This Encounter  Procedures   Culture, OB Urine   [place] MFM OB COMP + 14 WK   CBC/D/Plt+RPR+Rh+ABO+RubIgG...   Hemoglobpathy+Fer w/A Thal Rfx    Wynelle Bourgeois CNM, [facility] 06/26/2020 12:19 PM

## 2020-06-28 LAB — CYTOLOGY - PAP
Chlamydia: NEGATIVE
Comment: NEGATIVE
Comment: NORMAL
Diagnosis: NEGATIVE
Diagnosis: REACTIVE
Neisseria Gonorrhea: NEGATIVE

## 2020-06-28 LAB — URINE CULTURE, OB REFLEX

## 2020-06-28 LAB — CULTURE, OB URINE

## 2020-06-29 LAB — CBC/D/PLT+RPR+RH+ABO+RUBIGG...
Antibody Screen: NEGATIVE
Basophils Absolute: 0 10*3/uL (ref 0.0–0.2)
Basos: 1 %
EOS (ABSOLUTE): 0.1 10*3/uL (ref 0.0–0.4)
Eos: 2 %
HCV Ab: <0.1 s/co ratio (ref 0.0–0.9)
HIV Screen 4th Generation wRfx: NONREACTIVE
Hematocrit: 34.4 % (ref 34.0–46.6)
Hemoglobin: 11.5 g/dL (ref 11.1–15.9)
Hepatitis B Surface Ag: NEGATIVE
Immature Grans (Abs): 0 10*3/uL (ref 0.0–0.1)
Immature Granulocytes: 0 %
Lymphocytes Absolute: 1.6 10*3/uL (ref 0.7–3.1)
Lymphs: 26 %
MCH: 30.3 pg (ref 26.6–33.0)
MCHC: 33.4 g/dL (ref 31.5–35.7)
MCV: 91 fL (ref 79–97)
Monocytes Absolute: 0.5 10*3/uL (ref 0.1–0.9)
Monocytes: 8 %
Neutrophils Absolute: 3.9 10*3/uL (ref 1.4–7.0)
Neutrophils: 63 %
Platelets: 220 10*3/uL (ref 150–450)
RBC: 3.8 x10E6/uL (ref 3.77–5.28)
RDW: 12.8 % (ref 11.7–15.4)
RPR Ser Ql: NONREACTIVE
Rh Factor: POSITIVE
Rubella Antibodies, IGG: 1.39 index (ref >0.99)
WBC: 6.2 10*3/uL (ref 3.4–10.8)

## 2020-06-29 LAB — HEMOGLOBPATHY+FER W/A THAL RFX
Ferritin: 63 ng/mL (ref 15–150)
Hgb A2: 2.4 % (ref 1.8–3.2)

## 2020-06-29 LAB — HGB FRACTIONATION BY HPLC
Hgb A: 78 % — ABNORMAL LOW (ref 96.4–98.8)
Hgb C: 0 %
Hgb E: 0 %
Hgb F: 0 % (ref 0.0–2.0)
Hgb S: 0 %
Hgb Variant: 19.6 % — ABNORMAL HIGH

## 2020-06-29 LAB — HCV INTERPRETATION

## 2020-07-04 ENCOUNTER — Encounter: Payer: Self-pay | Admitting: Advanced Practice Midwife

## 2020-07-04 DIAGNOSIS — D582 Other hemoglobinopathies: Secondary | ICD-10-CM | POA: Insufficient documentation

## 2020-07-10 ENCOUNTER — Other Ambulatory Visit: Payer: Self-pay

## 2020-07-10 ENCOUNTER — Other Ambulatory Visit (INDEPENDENT_AMBULATORY_CARE_PROVIDER_SITE_OTHER): Payer: Medicaid Other

## 2020-07-10 DIAGNOSIS — Z3A11 11 weeks gestation of pregnancy: Secondary | ICD-10-CM

## 2020-07-10 DIAGNOSIS — Z1379 Encounter for other screening for genetic and chromosomal anomalies: Secondary | ICD-10-CM

## 2020-07-10 DIAGNOSIS — Z3481 Encounter for supervision of other normal pregnancy, first trimester: Secondary | ICD-10-CM | POA: Diagnosis not present

## 2020-07-10 DIAGNOSIS — Z31430 Encounter of female for testing for genetic disease carrier status for procreative management: Secondary | ICD-10-CM | POA: Diagnosis not present

## 2020-07-10 NOTE — Progress Notes (Signed)
Pt presents for Panorama lab. Panorama labs were drawn and shipped via [employer].  Victoria Miller l Kelleigh Skerritt, CMA

## 2020-07-11 ENCOUNTER — Other Ambulatory Visit: Payer: Self-pay

## 2020-07-11 ENCOUNTER — Inpatient Hospital Stay (HOSPITAL_COMMUNITY)
Admission: AD | Admit: 2020-07-11 | Discharge: 2020-07-11 | Disposition: A | Discharge status: Home / Self Care | Payer: Medicaid Other | Attending: Family Medicine | Admitting: Family Medicine

## 2020-07-11 DIAGNOSIS — Z79890 Hormone replacement therapy (postmenopausal): Secondary | ICD-10-CM | POA: Insufficient documentation

## 2020-07-11 DIAGNOSIS — O23591 Infection of other part of genital tract in pregnancy, first trimester: Secondary | ICD-10-CM | POA: Diagnosis not present

## 2020-07-11 DIAGNOSIS — B379 Candidiasis, unspecified: Secondary | ICD-10-CM | POA: Insufficient documentation

## 2020-07-11 DIAGNOSIS — O26891 Other specified pregnancy related conditions, first trimester: Secondary | ICD-10-CM | POA: Diagnosis not present

## 2020-07-11 DIAGNOSIS — N898 Other specified noninflammatory disorders of vagina: Secondary | ICD-10-CM | POA: Diagnosis not present

## 2020-07-11 DIAGNOSIS — Z3A11 11 weeks gestation of pregnancy: Secondary | ICD-10-CM | POA: Diagnosis not present

## 2020-07-11 DIAGNOSIS — Z3480 Encounter for supervision of other normal pregnancy, unspecified trimester: Secondary | ICD-10-CM

## 2020-07-11 LAB — WET PREP, GENITAL
Clue Cells Wet Prep HPF POC: NONE SEEN
Sperm: NONE SEEN
Trich, Wet Prep: NONE SEEN
Yeast Wet Prep HPF POC: NONE SEEN

## 2020-07-11 MED ORDER — TERCONAZOLE 0.4 % VA CREA: 1.0000 | TOPICAL_CREAM | Freq: Every day | VAGINAL | 0 refills | Status: DC

## 2020-07-11 NOTE — MAU Note (Signed)
Having some vaginal itching, and discomfort.  A lot of thick, clumpy white d/c for past wk. Called office, told to come in here.

## 2020-07-11 NOTE — MAU Provider Note (Signed)
History     CSN: [id number]  Arrival date and time: 07/11/20 1450   Event Date/Time   First Provider Initiated Contact with Patient 07/11/20 1543      Chief Complaint  Patient presents with   Vaginal Discharge   Victoria Miller is a 25 y.o. G3P1011 at [redacted]w[redacted]d who receives care at [facility].  She presents today for Vaginal Discharge.  She states she has been having white "clumpy discharge." She endorses vaginal itching that started this morning.  She states "I know something is off."  She states the color is also a "light yellow."  She states she was treated for a yeast infection back in May, but felt that the treatment was not effective.  She states she completed an OTC course, because she just "didn't feel right."     OB History     Gravida  3   Para  1   Term  1   Preterm      AB  1   Living  1      SAB      IAB  1   Ectopic      Multiple      Live Births  1           Past Medical History:  Diagnosis Date   Sickle cell anemia (HCC)    Trait   Thyroid disease     No past surgical history on file.  Family History  Problem Relation Age of Onset   Heart disease Mother    Arthritis Mother     Social History   Tobacco Use   Smoking status: Never   Smokeless tobacco: Never  Vaping Use   Vaping Use: Never used  Substance Use Topics   Alcohol use: Not Currently   Drug use: Not Currently    Allergies: No Known Allergies  Medications Prior to Admission  Medication Sig Dispense Refill Last Dose   albuterol (VENTOLIN HFA) 108 (90 Base) MCG/ACT inhaler Inhale into the lungs.      Blood Pressure Monitoring (BLOOD PRESSURE MONITOR AUTOMAT) DEVI 1 Device by Does not apply route daily. Automatic blood pressure cuff regular size. To monitor blood pressure regularly at home. ICD-10 code: O62.90 (Patient not taking: Reported on 06/26/2020) 1 each 0    Doxylamine-Pyridoxine (DICLEGIS) 10-10 MG TBEC Take 2 tablets by mouth at bedtime as needed (nausea). 60 tablet 3     levothyroxine (SYNTHROID) 125 MCG tablet Take 1 tablet (125 mcg total) by mouth daily before breakfast. Need to be seen by primary doctor prior to next refill request. 30 tablet 1    levothyroxine (SYNTHROID) 125 MCG tablet Take 1 tablet (125 mcg total) by mouth daily before breakfast. 30 tablet 1    levothyroxine (SYNTHROID) 125 MCG tablet Take by mouth.      Misc. Devices (GOJJI WEIGHT SCALE) MISC 1 Device by Does not apply route daily as needed. To weight self daily as needed at home. ICD-10 code: O70.90 (Patient not taking: Reported on 06/26/2020) 1 each 0    ondansetron (ZOFRAN ODT) 4 MG disintegrating tablet Take 1 tablet (4 mg total) by mouth every 6 (six) hours as needed for nausea. 20 tablet 0    Prenat-FeAsp-Meth-FA-DHA w/o A (PRENATE PIXIE) 10-0.6-0.4-200 MG CAPS Take 1 capsule by mouth daily. 9 capsule 0     Review of Systems  Constitutional:  Negative for chills and fever.  Genitourinary:  Positive for vaginal bleeding. Negative for difficulty urinating, dysuria and vaginal discharge.  Physical Exam   Blood pressure 120/71, pulse 80, temperature 98.3 F (36.8 C), temperature source Oral, resp. rate 17, weight 79.1 kg, last menstrual period 04/23/2020, SpO2 100 %, unknown if currently breastfeeding.  Physical Exam Constitutional:      Appearance: Normal appearance.  HENT:     Head: Normocephalic and atraumatic.  Eyes:     Conjunctiva/sclera: Conjunctivae normal.  Cardiovascular:     Rate and Rhythm: Normal rate.  Pulmonary:     Effort: Pulmonary effort is normal. No respiratory distress.  Abdominal:     General: Bowel sounds are normal.  Musculoskeletal:     Cervical back: Normal range of motion.  Neurological:     Mental Status: She is alert and oriented to person, place, and time.  Psychiatric:        Mood and Affect: Mood normal.        Behavior: Behavior normal.        Thought Content: Thought content normal.    MAU Course  Procedures Results for orders  placed or performed during the hospital encounter of 07/11/20 (from the past 24 hour(s))  Wet prep, genital     Status: Abnormal   Collection Time: 07/11/20  3:38 PM   Specimen: Vaginal  Result Value Ref Range   Yeast Wet Prep HPF POC NONE SEEN NONE SEEN   Trich, Wet Prep NONE SEEN NONE SEEN   Clue Cells Wet Prep HPF POC NONE SEEN NONE SEEN   WBC, Wet Prep HPF POC MANY (A) NONE SEEN   Sperm NONE SEEN     MDM Labs: Wet Prep Prescription Assessment and Plan  25 year old G3P1011 SIUP at 11.2 weeks Vaginal Itching  -Self collection of wet prep. -Patient informed that provider will treat for yeast infection. -Discussed how pregnancy can cause increased incidences of yeast infection. -Reviewed how nutritional intake modifications can cause decreases in yeast infection.  -Discussed treatment with Terazol cream.  Informed that will treat despite wet prep results. -Patient agreeable and questions if she could come to MAU for all pregnancy concerns. -Provider clarifies that MAU is for pregnancy related emergencies and that some things, like vaginal discharge, can be managed in office. -Patient verbalizes understanding and has no other questions. -Encouraged to call or return to MAU if symptoms worsen or with the onset of new symptoms. -Discharged to home in stable condition.   Cherre Robins 07/11/2020, 3:43 PM

## 2020-07-20 ENCOUNTER — Encounter: Payer: Self-pay | Admitting: General Practice

## 2020-07-23 ENCOUNTER — Encounter: Payer: Self-pay | Admitting: General Practice

## 2020-07-26 ENCOUNTER — Other Ambulatory Visit: Payer: Self-pay | Admitting: Family Medicine

## 2020-07-26 ENCOUNTER — Ambulatory Visit (INDEPENDENT_AMBULATORY_CARE_PROVIDER_SITE_OTHER): Payer: Medicaid Other | Admitting: Family Medicine

## 2020-07-26 ENCOUNTER — Other Ambulatory Visit: Payer: Self-pay

## 2020-07-26 ENCOUNTER — Ambulatory Visit: Payer: Medicaid Other | Attending: Family Medicine

## 2020-07-26 VITALS — BP 126/75 | HR 92 | Wt 170.0 lb

## 2020-07-26 DIAGNOSIS — Z3A13 13 weeks gestation of pregnancy: Secondary | ICD-10-CM | POA: Diagnosis not present

## 2020-07-26 DIAGNOSIS — O289 Unspecified abnormal findings on antenatal screening of mother: Secondary | ICD-10-CM

## 2020-07-26 DIAGNOSIS — R42 Dizziness and giddiness: Secondary | ICD-10-CM | POA: Diagnosis not present

## 2020-07-26 DIAGNOSIS — E039 Hypothyroidism, unspecified: Secondary | ICD-10-CM | POA: Diagnosis not present

## 2020-07-26 DIAGNOSIS — O99281 Endocrine, nutritional and metabolic diseases complicating pregnancy, first trimester: Secondary | ICD-10-CM

## 2020-07-26 DIAGNOSIS — R898 Other abnormal findings in specimens from other organs, systems and tissues: Secondary | ICD-10-CM

## 2020-07-26 DIAGNOSIS — Z3480 Encounter for supervision of other normal pregnancy, unspecified trimester: Secondary | ICD-10-CM

## 2020-07-26 MED ORDER — ONDANSETRON 4 MG PO TBDP
4.0000 mg | ORAL_TABLET | Freq: Four times a day (QID) | ORAL | 3 refills | Status: DC | PRN
  Filled 2020-07-26: qty 90, 23d supply, fill #0

## 2020-07-26 NOTE — Progress Notes (Signed)
   PRENATAL VISIT NOTE  Subjective:  Victoria Miller is a 25 y.o. G3P1011 at 36w3dbeing seen today for ongoing prenatal care.  She is currently monitored for the following issues for this high-risk pregnancy and has Sickle cell trait ([place]; Hypothyroidism; Ruptured ovarian cyst; Bacterial vaginosis; Supervision of other normal pregnancy, antepartum; Abnormal hemoglobin (HCC); and Abnormal genetic test on their problem list.  Patient reports nausea and vomiting, having dizzy episodes with vomiting. Has lost 3 pounds since last appt..  Contractions: Not present. Vag. Bleeding: None.  Movement: Absent. Denies leaking of fluid.   The following portions of the patient's history were reviewed and updated as appropriate: allergies, current medications, past family history, past medical history, past social history, past surgical history and problem list.   Objective:   Vitals:   07/26/20 1357  BP: 126/75  Pulse: 92  Weight: 170 lb (77.1 kg)    Fetal Status: Fetal Heart Rate (bpm): 166   Movement: Absent     General:  Alert, oriented and cooperative. Patient is in no acute distress.  Skin: Skin is warm and dry. No rash noted.   Cardiovascular: Normal heart rate noted  Respiratory: Normal respiratory effort, no problems with respiration noted  Abdomen: Soft, gravid, appropriate for gestational age.  Pain/Pressure: Absent     Pelvic: Cervical exam deferred        Extremities: Normal range of motion.  Edema: None  Mental Status: Normal mood and affect. Normal behavior. Normal judgment and thought content.   Assessment and Plan:  Pregnancy: G3P1011 at 156w3d. Supervision of other normal pregnancy, antepartum FHT and FH normal - [place] OB DETAIL +14 WK; Future  2. Acquired hypothyroidism This was not done with prenatal labs - will check today - TSH - [place] OB DETAIL +14 WK; Future  3. Dizziness Check CMP - Comp Met (CMET)  4. Abnormal genetic test Discussed results. Will refer to  genetic counseling and MFM for definitive testing. - AMB MFM GENETICS REFERRAL - AMB referral to maternal fetal medicine - [place] OB DETAIL +14 WK; Future  Preterm labor symptoms and general obstetric precautions including but not limited to vaginal bleeding, contractions, leaking of fluid and fetal movement were reviewed in detail with the patient. Please refer to After Visit Summary for other counseling recommendations.   No follow-ups on file.  Future Appointments  Date Time Provider [place]8/18/2022  9:35 AM [place] [facility] None  09/03/2020 10:30 AM [facility] US3 [facility] [place]

## 2020-07-26 NOTE — Progress Notes (Signed)
Referring provider:  Dr. Candelaria Celeste Length of consultation:  30 minutes  [name] was referred for genetic counseling at [facility], as results from noninvasive prenatal screening (NIPS) came back positive for an increased risk of trisomy 32, or Down syndrome, in the current pregnancy. We reviewed these results in detail along with testing options for the current pregnancy. The encounter was provided by phone due to limitations in the schedule and her gestational age. The patient was at home and provider in clinic.  The patient's partner was not present on the call.   To review, Down syndrome is one of the most common extra chromosome conditions, as approximately 1 in 800 babies are born with this condition. There are different types of Down syndrome, with each type determined by the arrangement of the chromosome 21 pair. Approximately 95% of cases are caused by an entire extra copy of chromosome 21 (trisomy 21), and 2-4% of cases are due to a chromosomal rearrangement (translocation) involving chromosome 21. We reviewed that Down syndrome most commonly occurs by chance due to an error in chromosomal division during the formation of egg and sperm cells in a process called nondisjunction.    Down syndrome is characterized by a distinctive facial appearance, mild to moderate intellectual disability, and an increased chance for a heart defect. Approximately half of babies with Down syndrome are born with a heart defect that may require surgery after birth. While many children with Down syndrome look similar to each other, each child with Down syndrome is unique and will have many more features in common with his or her own family members. Children with Down syndrome also have an increased chance for thyroid problems, which can range from an underactive to an overactive thyroid. Additionally, low muscle tone, gastrointestinal abnormalities, vision problems, and respiratory and ear infections  are more common among babies with Down syndrome. We discussed that there are many more features that can be associated with Down syndrome; however, it is not possible to accurately predict all features that would be present in an individual with Down syndrome prenatally. Additionally, there is a high degree of variability seen among children who have this condition, meaning that every child with Down syndrome will not be affected in exactly the same way, and some children will have more or less features than others. It is not possible to predict what strengths and weaknesses a child with Down syndrome will have, just like it is not possible to predict this for any child. With the advances in medical technology, early intervention, and supportive therapies, many individuals with Down syndrome are able to live with an increasing degree of independence. Today, many adults with Down syndrome care for themselves, have jobs, and often live in group homes or apartments where assistance is available if needed.   We reviewed that NIPS analyzes cell free DNA originating from the placenta that is found in the maternal blood circulation during pregnancy. This test can provide information regarding the presence or absence of extra fetal DNA for chromosomes 13, 18 and 21 as well as the sex chromosomes. The reported detection rate is greater than 99% for trisomy 21, greater than 99% for trisomy 18, greater than 91% for trisomy 13, and greater than 94% for monosomy X. However, it is not be considered diagnostic for chromosome conditions. Positive predictive value (PPV) is the probability that a pregnancy with a positive test result is truly affected. The PPV calculator offered by the [employer] and the [employer] takes into consideration both the maternal age as well as the lab results, and estimated the PPV for trisomy 21 in the current pregnancy to be 51%. Thus, there is a 49%  chance that this could be a false positive result.   [name]  was counseled that there are several possible explanations for her high risk NIPS result. Firstly, the fetus could truly be affected by Down syndrome. Secondly, the fetus could be mosaic for Down syndrome, though this is rare. Mosaicism occurs when an individual has two or more genetically different sets of cells in their body. Individuals who are mosaic for Down syndrome have some cells in the body with trisomy 21 and may have other cells that are chromosomally normal. We discussed that it would not be possible to tell which features an individual with mosaic Down syndrome may have, as it is impossible to assess which specific cells and tissues in the body have Down syndrome. Individuals who are mosaic for Down syndrome may be more mildly affected than individuals with full trisomy 21, though this is not always the case. Thirdly, the placenta could have Down syndrome while the fetus could be unaffected. This is a phenomenon known as confined placental mosaicism (CPM). Lastly, this could be a false positive result that resulted due to normal variation.  Diagnostic testing:   [name] was also counseled regarding diagnostic testing via CVS or amniocentesis.   CVS is typically performed between 11-[redacted] weeks gestation.  We reviewed the procedure as well as the risk for complications including miscarriage of 1 in 200 to 1 in 500 with this testing.  Cultured chorionic villi, which are placental cells, allow for the visualization of a fetal karyotype, which can detect >99% of chromosomal aberrations. Chromosomal microarray can also be performed to identify smaller deletions or duplications of fetal chromosomal material. Because this testing evaluates the placental cells, there is a small chance for confined placental mosaicism, when the chromosome complement of the placenta is different from that of the fetus. This chance is approximately  1%.  Amniocentesis is an option beginning at 73 weeks' gestation. We discussed the technical aspects of the procedure and quoted up to a 1 in 500 (0.2%) risk for spontaneous pregnancy loss or other adverse pregnancy outcomes as a result of amniocentesis. Cultured cells from an amniocentesis sample allow for the visualization of a fetal karyotype, which can detect >99% of chromosomal aberrations. Chromosomal microarray can also be performed to identify smaller deletions or duplications of fetal chromosomal material. [name] was informed that diagnostic testing would be the only way to definitively determine if the fetus has Down syndrome prenatally.   [name] was also made aware that she has the option of continuing with standard ultrasounds and pursuing genetic testing postnatally. We discussed that approximately half (50%) of fetuses with Down syndrome demonstrate signs of the condition on anatomy ultrasound. Additionally, fetal echocardiogram for a deeper assessment of cardiac defects is recommended given the increased risk for congenital heart defects associated with Down syndrome.  After careful consideration, [name] elected to schedule a CVS for 07/27/20 with Dr. Judeth Cornfield. We will order FISH, chromosome analysis with reflex to microarray and maternal cell contamination for the sample.  Patient blood type is A positive.  We inquired about the family history and pregnancy history.  The patient stated that the family history was unremarkable for birth defects, learning differences, recurrent pregnancy loss and known genetic conditions.  The patient  reported no complications or exposures in the pregnancy that would be expected to increase the risk for birth defects. We did not discuss carrier screening at this time.  The patient was encouraged to contact us at [phone] with any questions or concerns.  Plan of care: CVS on 07/27/20.  Testing to be ordered includes FISH, Chromosomes with  reflex to microarray, MCC. Patient expresed that she and her partner will likely not continue the pregnancy if there is a chromosome condition. We did review time limits and termination options in [place].  Cherly Anderson, MS, CGC  I connected with  Thomes Lolling on 07/26/20 by telephone and verified that I am speaking with the correct person using two identifiers.   I discussed the limitations of evaluation and management by telemedicine. The patient expressed understanding and agreed to proceed.  The patient was at home and I was in clinic.

## 2020-07-27 ENCOUNTER — Ambulatory Visit: Payer: Medicaid Other | Admitting: *Deleted

## 2020-07-27 ENCOUNTER — Ambulatory Visit (HOSPITAL_BASED_OUTPATIENT_CLINIC_OR_DEPARTMENT_OTHER): Payer: Medicaid Other

## 2020-07-27 ENCOUNTER — Ambulatory Visit: Payer: Medicaid Other

## 2020-07-27 ENCOUNTER — Encounter: Payer: Self-pay | Admitting: *Deleted

## 2020-07-27 ENCOUNTER — Other Ambulatory Visit: Payer: Self-pay

## 2020-07-27 ENCOUNTER — Other Ambulatory Visit: Payer: Self-pay | Admitting: Family Medicine

## 2020-07-27 ENCOUNTER — Telehealth: Payer: Self-pay | Admitting: Family Medicine

## 2020-07-27 VITALS — BP 124/69 | HR 83

## 2020-07-27 DIAGNOSIS — O289 Unspecified abnormal findings on antenatal screening of mother: Secondary | ICD-10-CM | POA: Insufficient documentation

## 2020-07-27 DIAGNOSIS — Z3A13 13 weeks gestation of pregnancy: Secondary | ICD-10-CM | POA: Insufficient documentation

## 2020-07-27 DIAGNOSIS — Z3480 Encounter for supervision of other normal pregnancy, unspecified trimester: Secondary | ICD-10-CM

## 2020-07-27 DIAGNOSIS — Z363 Encounter for antenatal screening for malformations: Secondary | ICD-10-CM | POA: Insufficient documentation

## 2020-07-27 DIAGNOSIS — E039 Hypothyroidism, unspecified: Secondary | ICD-10-CM

## 2020-07-27 DIAGNOSIS — O99281 Endocrine, nutritional and metabolic diseases complicating pregnancy, first trimester: Secondary | ICD-10-CM

## 2020-07-27 DIAGNOSIS — O021 Missed abortion: Secondary | ICD-10-CM

## 2020-07-27 DIAGNOSIS — O99284 Endocrine, nutritional and metabolic diseases complicating childbirth: Secondary | ICD-10-CM | POA: Insufficient documentation

## 2020-07-27 LAB — COMPREHENSIVE METABOLIC PANEL
ALT: 10 IU/L (ref 0–32)
AST: 19 IU/L (ref 0–40)
Albumin/Globulin Ratio: 1.3 (ref 1.2–2.2)
Albumin: 3.9 g/dL (ref 3.9–5.0)
Alkaline Phosphatase: 37 IU/L — ABNORMAL LOW (ref 44–121)
BUN/Creatinine Ratio: 16 (ref 9–23)
BUN: 10 mg/dL (ref 6–20)
Bilirubin Total: 0.3 mg/dL (ref 0.0–1.2)
CO2: 22 mmol/L (ref 20–29)
Calcium: 9.3 mg/dL (ref 8.7–10.2)
Chloride: 99 mmol/L (ref 96–106)
Creatinine, Ser: 0.64 mg/dL (ref 0.57–1.00)
Globulin, Total: 3 g/dL (ref 1.5–4.5)
Glucose: 84 mg/dL (ref 65–99)
Potassium: 3.8 mmol/L (ref 3.5–5.2)
Sodium: 137 mmol/L (ref 134–144)
Total Protein: 6.9 g/dL (ref 6.0–8.5)
eGFR: 126 mL/min/{1.73_m2} (ref >59)

## 2020-07-27 LAB — TSH: TSH: 3.1 u[IU]/mL (ref 0.450–4.500)

## 2020-07-27 NOTE — Telephone Encounter (Signed)
Discussed results with patient - will get scheduled for D&C. Discussed that if she develops fevers, chills, vomiting, bleeding, severe cramping she should go to the hospital.

## 2020-07-29 ENCOUNTER — Encounter (HOSPITAL_COMMUNITY): Payer: Self-pay | Admitting: Obstetrics and Gynecology

## 2020-07-29 ENCOUNTER — Inpatient Hospital Stay (HOSPITAL_COMMUNITY)
Admission: AD | Admit: 2020-07-29 | Discharge: 2020-07-29 | Disposition: A | Discharge status: Home / Self Care | Payer: Medicaid Other | Attending: Obstetrics and Gynecology | Admitting: Obstetrics and Gynecology

## 2020-07-29 ENCOUNTER — Other Ambulatory Visit: Payer: Self-pay

## 2020-07-29 DIAGNOSIS — Z79890 Hormone replacement therapy (postmenopausal): Secondary | ICD-10-CM | POA: Insufficient documentation

## 2020-07-29 DIAGNOSIS — Z3A13 13 weeks gestation of pregnancy: Secondary | ICD-10-CM

## 2020-07-29 DIAGNOSIS — O26891 Other specified pregnancy related conditions, first trimester: Secondary | ICD-10-CM

## 2020-07-29 DIAGNOSIS — O021 Missed abortion: Secondary | ICD-10-CM | POA: Diagnosis not present

## 2020-07-29 DIAGNOSIS — G44209 Tension-type headache, unspecified, not intractable: Secondary | ICD-10-CM | POA: Diagnosis not present

## 2020-07-29 DIAGNOSIS — R109 Unspecified abdominal pain: Secondary | ICD-10-CM

## 2020-07-29 DIAGNOSIS — O26899 Other specified pregnancy related conditions, unspecified trimester: Secondary | ICD-10-CM

## 2020-07-29 DIAGNOSIS — R519 Headache, unspecified: Secondary | ICD-10-CM | POA: Diagnosis present

## 2020-07-29 DIAGNOSIS — O99351 Diseases of the nervous system complicating pregnancy, first trimester: Secondary | ICD-10-CM | POA: Diagnosis not present

## 2020-07-29 LAB — COMPREHENSIVE METABOLIC PANEL
ALT: 12 U/L (ref 0–44)
AST: 20 U/L (ref 15–41)
Albumin: 3.4 g/dL — ABNORMAL LOW (ref 3.5–5.0)
Alkaline Phosphatase: 29 U/L — ABNORMAL LOW (ref 38–126)
Anion gap: 8 (ref 5–15)
BUN: 9 mg/dL (ref 6–20)
CO2: 22 mmol/L (ref 22–32)
Calcium: 9.4 mg/dL (ref 8.9–10.3)
Chloride: 105 mmol/L (ref 98–111)
Creatinine, Ser: 0.53 mg/dL (ref 0.44–1.00)
GFR, Estimated: >60 mL/min (ref >60)
Glucose, Bld: 88 mg/dL (ref 70–99)
Potassium: 4.1 mmol/L (ref 3.5–5.1)
Sodium: 135 mmol/L (ref 135–145)
Total Bilirubin: 0.6 mg/dL (ref 0.3–1.2)
Total Protein: 6.8 g/dL (ref 6.5–8.1)

## 2020-07-29 LAB — URINALYSIS, ROUTINE W REFLEX MICROSCOPIC
Bilirubin Urine: NEGATIVE
Glucose, UA: NEGATIVE mg/dL
Hgb urine dipstick: NEGATIVE
Ketones, ur: NEGATIVE mg/dL
Nitrite: NEGATIVE
Protein, ur: NEGATIVE mg/dL
Specific Gravity, Urine: 1.02 (ref 1.005–1.030)
pH: 6 (ref 5.0–8.0)

## 2020-07-29 LAB — CBC
HCT: 33 % — ABNORMAL LOW (ref 36.0–46.0)
Hemoglobin: 11.5 g/dL — ABNORMAL LOW (ref 12.0–15.0)
MCH: 30.3 pg (ref 26.0–34.0)
MCHC: 34.8 g/dL (ref 30.0–36.0)
MCV: 87.1 fL (ref 80.0–100.0)
Platelets: 215 10*3/uL (ref 150–400)
RBC: 3.79 MIL/uL — ABNORMAL LOW (ref 3.87–5.11)
RDW: 12.6 % (ref 11.5–15.5)
WBC: 7.4 10*3/uL (ref 4.0–10.5)
nRBC: 0 % (ref 0.0–0.2)

## 2020-07-29 MED ORDER — DIPHENHYDRAMINE HCL 50 MG/ML IJ SOLN
25.0000 mg | Freq: Once | INTRAMUSCULAR | Status: AC
  Administered 2020-07-29: 25 mg via INTRAVENOUS
  Filled 2020-07-29: qty 1

## 2020-07-29 MED ORDER — DEXAMETHASONE SODIUM PHOSPHATE 10 MG/ML IJ SOLN
10.0000 mg | Freq: Once | INTRAMUSCULAR | Status: AC
  Administered 2020-07-29: 10 mg via INTRAVENOUS
  Filled 2020-07-29: qty 1

## 2020-07-29 MED ORDER — KETOROLAC TROMETHAMINE 30 MG/ML IJ SOLN
30.0000 mg | Freq: Once | INTRAMUSCULAR | Status: AC
  Administered 2020-07-29: 30 mg via INTRAVENOUS
  Filled 2020-07-29: qty 1

## 2020-07-29 MED ORDER — METOCLOPRAMIDE HCL 5 MG/ML IJ SOLN
10.0000 mg | Freq: Once | INTRAMUSCULAR | Status: AC
  Administered 2020-07-29: 10 mg via INTRAVENOUS
  Filled 2020-07-29: qty 2

## 2020-07-29 MED ORDER — LACTATED RINGERS IV BOLUS
1000.0000 mL | Freq: Once | INTRAVENOUS | Status: AC
  Administered 2020-07-29: 1000 mL via INTRAVENOUS

## 2020-07-29 MED ORDER — KETOROLAC TROMETHAMINE 10 MG PO TABS: 10.0000 mg | ORAL_TABLET | Freq: Four times a day (QID) | ORAL | 0 refills | Status: DC | PRN

## 2020-07-29 NOTE — MAU Note (Signed)
Pt reports to mau with c/o lower abd cramping and a headache.  Pt denies vag bleeding.  Pt states she was informed on Friday that her baby did not have a heart beat.  Pt reports cramping started that day.  States she was supposed to be scheduled for d and c next week.

## 2020-07-29 NOTE — MAU Provider Note (Signed)
History     CSN: [id number]  Arrival date and time: 07/29/20 1446   Event Date/Time   First Provider Initiated Contact with Patient 07/29/20 1517      Chief Complaint  Patient presents with   Abdominal Pain   Headache   24 y.o. G3P1011 @13 .6 wks with known IUFD presenting with HA and abd cramping. Reports onset of sx 2 days ago when she found out her baby had no heartbeat. HA is frontal and bilateral temporal. Rates pain 8/10. She has used Aleve and was able to sleep but hasn't taken any today. No visual disturbances. Associated sx are lightheadedness. LAP is intermittent bilateral and central. Pain alternates location. Rates pain 7/10. Denies urinary sx. No Vb or concerning vaginal discharge. Also reports several episodes of N/V. Last was early this am. She is tolerating po since. She will be scheduled for D&E this week.   OB History     Gravida  3   Para  1   Term  1   Preterm      AB  1   Living  1      SAB      IAB  1   Ectopic      Multiple      Live Births  1           Past Medical History:  Diagnosis Date   Sickle cell anemia (HCC)    Trait   Thyroid disease     Past Surgical History:  Procedure Laterality Date   NO PAST SURGERIES      Family History  Problem Relation Age of Onset   Heart disease Mother    Arthritis Mother     Social History   Tobacco Use   Smoking status: Never   Smokeless tobacco: Never  Vaping Use   Vaping Use: Never used  Substance Use Topics   Alcohol use: Not Currently   Drug use: Not Currently    Allergies: No Known Allergies  Medications Prior to Admission  Medication Sig Dispense Refill Last Dose   albuterol (VENTOLIN HFA) 108 (90 Base) MCG/ACT inhaler Inhale into the lungs.      Blood Pressure Monitoring (BLOOD PRESSURE MONITOR AUTOMAT) DEVI 1 Device by Does not apply route daily. Automatic blood pressure cuff regular size. To monitor blood pressure regularly at home. ICD-10 code: O09.90 1 each 0     Doxylamine-Pyridoxine (DICLEGIS) 10-10 MG TBEC Take 2 tablets by mouth at bedtime as needed (nausea). 60 tablet 3    levothyroxine (SYNTHROID) 125 MCG tablet Take 1 tablet (125 mcg total) by mouth daily before breakfast. Need to be seen by primary doctor prior to next refill request. 30 tablet 1    levothyroxine (SYNTHROID) 125 MCG tablet Take 1 tablet (125 mcg total) by mouth daily before breakfast. (Patient not taking: Reported on 07/26/2020) 30 tablet 1    levothyroxine (SYNTHROID) 125 MCG tablet Take by mouth.      Misc. Devices (GOJJI WEIGHT SCALE) MISC 1 Device by Does not apply route daily as needed. To weight self daily as needed at home. ICD-10 code: O79.90 (Patient not taking: Reported on 07/26/2020) 1 each 0    ondansetron (ZOFRAN ODT) 4 MG disintegrating tablet Take 1 tablet (4 mg total) by mouth every 6 (six) hours as needed for nausea. 90 tablet 3    Prenat-FeAsp-Meth-FA-DHA w/o A (PRENATE PIXIE) 10-0.6-0.4-200 MG CAPS Take 1 capsule by mouth daily. 9 capsule 0    terconazole (TERAZOL 7)  0.4 % vaginal cream Place 1 applicator vaginally at bedtime. (Patient not taking: Reported on 07/26/2020) 45 g 0     Review of Systems  Constitutional:  Negative for fever.  Eyes:  Negative for visual disturbance.  Gastrointestinal:  Positive for abdominal pain, nausea and vomiting.  Genitourinary:  Negative for dysuria, hematuria, urgency, vaginal bleeding and vaginal discharge.  Neurological:  Positive for light-headedness and headaches. Negative for syncope.  Physical Exam   Blood pressure 119/74, pulse 75, temperature 98.2 F (36.8 C), resp. rate 15, last menstrual period 04/23/2020, SpO2 99 %, unknown if currently breastfeeding.  Physical Exam Vitals and nursing note reviewed. Exam conducted with a chaperone present.  Constitutional:      General: She is not in acute distress.    Appearance: Normal appearance.  HENT:     Head: Normocephalic and atraumatic.  Eyes:     Extraocular  Movements: Extraocular movements intact.     Pupils: Pupils are equal, round, and reactive to light.  Cardiovascular:     Rate and Rhythm: Normal rate.  Pulmonary:     Effort: Pulmonary effort is normal. No respiratory distress.  Abdominal:     General: There is no distension.     Palpations: Abdomen is soft. There is no mass.     Tenderness: There is no abdominal tenderness. There is no guarding or rebound.     Hernia: No hernia is present.  Musculoskeletal:        General: Normal range of motion.     Cervical back: Normal range of motion.  Skin:    General: Skin is warm and dry.  Neurological:     General: No focal deficit present.     Mental Status: She is alert and oriented to person, place, and time.     Cranial Nerves: No cranial nerve deficit.     Motor: No weakness.     Deep Tendon Reflexes: Reflexes normal.  Psychiatric:        Mood and Affect: Mood normal.        Behavior: Behavior normal.   Results for orders placed or performed during the hospital encounter of 07/29/20 (from the past 24 hour(s))  CBC     Status: Abnormal   Collection Time: 07/29/20  3:48 PM  Result Value Ref Range   WBC 7.4 4.0 - 10.5 K/uL   RBC 3.79 (L) 3.87 - 5.11 MIL/uL   Hemoglobin 11.5 (L) 12.0 - 15.0 g/dL   HCT 02.7 (L) 25.3 - 66.4 %   MCV 87.1 80.0 - 100.0 fL   MCH 30.3 26.0 - 34.0 pg   MCHC 34.8 30.0 - 36.0 g/dL   RDW 40.3 47.4 - 25.9 %   Platelets 215 150 - 400 K/uL   nRBC 0.0 0.0 - 0.2 %  Comprehensive metabolic panel     Status: Abnormal   Collection Time: 07/29/20  3:48 PM  Result Value Ref Range   Sodium 135 135 - 145 mmol/L   Potassium 4.1 3.5 - 5.1 mmol/L   Chloride 105 98 - 111 mmol/L   CO2 22 22 - 32 mmol/L   Glucose, Bld 88 70 - 99 mg/dL   BUN 9 6 - 20 mg/dL   Creatinine, Ser 5.63 0.44 - 1.00 mg/dL   Calcium 9.4 8.9 - 87.5 mg/dL   Total Protein 6.8 6.5 - 8.1 g/dL   Albumin 3.4 (L) 3.5 - 5.0 g/dL   AST 20 15 - 41 U/L   ALT 12 0 -  44 U/L   Alkaline Phosphatase 29 (L)  38 - 126 U/L   Total Bilirubin 0.6 0.3 - 1.2 mg/dL   GFR, Estimated >26 >94 mL/min   Anion gap 8 5 - 15  Urinalysis, Routine w reflex microscopic Urine, Clean Catch     Status: Abnormal   Collection Time: 07/29/20  4:05 PM  Result Value Ref Range   Color, Urine YELLOW YELLOW   APPearance CLOUDY (A) CLEAR   Specific Gravity, Urine 1.020 1.005 - 1.030   pH 6.0 5.0 - 8.0   Glucose, UA NEGATIVE NEGATIVE mg/dL   Hgb urine dipstick NEGATIVE NEGATIVE   Bilirubin Urine NEGATIVE NEGATIVE   Ketones, ur NEGATIVE NEGATIVE mg/dL   Protein, ur NEGATIVE NEGATIVE mg/dL   Nitrite NEGATIVE NEGATIVE   Leukocytes,Ua LARGE (A) NEGATIVE   RBC / HPF 0-5 0 - 5 RBC/hpf   WBC, UA 6-10 0 - 5 WBC/hpf   Bacteria, UA RARE (A) NONE SEEN   Squamous Epithelial / LPF 21-50 0 - 5   Mucus PRESENT    MAU Course  Procedures LR Reglan Decadron Benadryl Toradol  MDM Labs ordered and reviewed. HA resolved. Abd pain improved. Stable for discharge.  Assessment and Plan   1. Acute non intractable tension-type headache   2. IUFD at less than 20 weeks of gestation   3. Abdominal cramping affecting pregnancy    Discharge home Follow up for outpt surgery- scheduler will arrange Return precautions Rx Toradol  Allergies as of 07/29/2020   No Known Allergies      Medication List     STOP taking these medications    Blood Pressure Monitor Automat Devi   Gojji Weight Scale Misc   terconazole 0.4 % vaginal cream Commonly known as: Terazol 7       TAKE these medications    albuterol 108 (90 Base) MCG/ACT inhaler Commonly known as: VENTOLIN HFA Inhale into the lungs.   Doxylamine-Pyridoxine 10-10 MG Tbec Commonly known as: Diclegis Take 2 tablets by mouth at bedtime as needed (nausea).   ketorolac 10 MG tablet Commonly known as: TORADOL Take 1 tablet (10 mg total) by mouth every 6 (six) hours as needed for moderate pain.   levothyroxine 125 MCG tablet Commonly known as: SYNTHROID Take 1  tablet (125 mcg total) by mouth daily before breakfast. Need to be seen by primary doctor prior to next refill request. What changed: Another medication with the same name was removed. Continue taking this medication, and follow the directions you see here.   ondansetron 4 MG disintegrating tablet Commonly known as: Zofran ODT Take 1 tablet (4 mg total) by mouth every 6 (six) hours as needed for nausea.   Prenate Pixie 10-0.6-0.4-200 MG Caps Take 1 capsule by mouth daily.       Donette Larry, CNM 07/29/2020, 5:56 PM

## 2020-07-30 ENCOUNTER — Encounter (HOSPITAL_BASED_OUTPATIENT_CLINIC_OR_DEPARTMENT_OTHER): Payer: Self-pay | Admitting: Obstetrics and Gynecology

## 2020-07-30 ENCOUNTER — Other Ambulatory Visit: Payer: Self-pay

## 2020-07-30 ENCOUNTER — Telehealth: Payer: Self-pay

## 2020-07-30 ENCOUNTER — Other Ambulatory Visit (HOSPITAL_COMMUNITY)
Admission: RE | Admit: 2020-07-30 | Discharge: 2020-07-30 | Disposition: A | Discharge status: Home / Self Care | Payer: Medicaid Other | Source: Ambulatory Visit | Attending: Obstetrics and Gynecology | Admitting: Obstetrics and Gynecology

## 2020-07-30 DIAGNOSIS — Z01812 Encounter for preprocedural laboratory examination: Secondary | ICD-10-CM | POA: Insufficient documentation

## 2020-07-30 DIAGNOSIS — Z20822 Contact with and (suspected) exposure to covid-19: Secondary | ICD-10-CM | POA: Diagnosis not present

## 2020-07-30 LAB — SARS CORONAVIRUS 2 (TAT 6-24 HRS): SARS Coronavirus 2: NEGATIVE

## 2020-07-30 NOTE — Telephone Encounter (Signed)
Patient called, surgery date, time and preop instructions given.

## 2020-07-30 NOTE — Progress Notes (Signed)
Spoke w/ via phone for pre-op interview--- Pt Lab needs dos----   no (per anes)/  pre-op orders pending            Lab results------ pt has current lab in epic done 07-29-2020, CBC/ CMP, results in epic COVID test -----patient had covid test done 07-30-2020 as requested by surgeon Arrive at ------- 0830 on 08-01-2020 NPO after MN NO Solid Food.  Clear liquids from MN until--- 0730 Med rec completed Medications to take morning of surgery ----- may take one type of nausea prescription if needed Diabetic medication ----- none Patient instructed no nail polish to be worn day of surgery Patient instructed to bring photo id and insurance card day of surgery Patient aware to have Driver (ride ) / caregiver for 24 hours after surgery --sig other, Victoria Miller Patient Special Instructions ----- n/a Pre-Op special Istructions ----- pt case just added on today, pre-op orders pending Patient verbalized understanding of instructions that were given at this phone interview. Patient denies shortness of breath, chest pain, fever, cough at this phone interview.

## 2020-07-31 ENCOUNTER — Other Ambulatory Visit: Payer: Self-pay | Admitting: Obstetrics and Gynecology

## 2020-07-31 ENCOUNTER — Encounter: Payer: Self-pay | Admitting: Obstetrics and Gynecology

## 2020-07-31 ENCOUNTER — Encounter (HOSPITAL_BASED_OUTPATIENT_CLINIC_OR_DEPARTMENT_OTHER): Payer: Self-pay | Admitting: Anesthesiology

## 2020-07-31 DIAGNOSIS — O021 Missed abortion: Secondary | ICD-10-CM

## 2020-07-31 DIAGNOSIS — N938 Other specified abnormal uterine and vaginal bleeding: Secondary | ICD-10-CM | POA: Diagnosis not present

## 2020-07-31 DIAGNOSIS — R110 Nausea: Secondary | ICD-10-CM | POA: Diagnosis not present

## 2020-07-31 DIAGNOSIS — O034 Incomplete spontaneous abortion without complication: Secondary | ICD-10-CM | POA: Diagnosis not present

## 2020-07-31 DIAGNOSIS — O039 Complete or unspecified spontaneous abortion without complication: Secondary | ICD-10-CM | POA: Diagnosis not present

## 2020-07-31 MED ORDER — MISOPROSTOL 200 MCG PO TABS: ORAL_TABLET | ORAL | 0 refills | Status: DC

## 2020-08-01 ENCOUNTER — Ambulatory Visit (HOSPITAL_COMMUNITY): Admission: RE | Admit: 2020-08-01 | Payer: Medicaid Other | Source: Ambulatory Visit

## 2020-08-01 ENCOUNTER — Encounter (HOSPITAL_BASED_OUTPATIENT_CLINIC_OR_DEPARTMENT_OTHER): Payer: Self-pay | Admitting: Obstetrics and Gynecology

## 2020-08-01 ENCOUNTER — Encounter: Payer: Self-pay | Admitting: Obstetrics and Gynecology

## 2020-08-01 ENCOUNTER — Ambulatory Visit (HOSPITAL_BASED_OUTPATIENT_CLINIC_OR_DEPARTMENT_OTHER)
Admission: RE | Admit: 2020-08-01 | Payer: Medicaid Other | Source: Home / Self Care | Admitting: Obstetrics and Gynecology

## 2020-08-01 DIAGNOSIS — R58 Hemorrhage, not elsewhere classified: Secondary | ICD-10-CM | POA: Diagnosis not present

## 2020-08-01 DIAGNOSIS — O479 False labor, unspecified: Secondary | ICD-10-CM | POA: Diagnosis not present

## 2020-08-01 DIAGNOSIS — O039 Complete or unspecified spontaneous abortion without complication: Secondary | ICD-10-CM | POA: Diagnosis not present

## 2020-08-01 DIAGNOSIS — O029 Abnormal product of conception, unspecified: Secondary | ICD-10-CM | POA: Diagnosis not present

## 2020-08-01 DIAGNOSIS — R110 Nausea: Secondary | ICD-10-CM | POA: Diagnosis not present

## 2020-08-01 HISTORY — DX: Sickle-cell trait: D57.3

## 2020-08-01 HISTORY — DX: Personal history of other diseases of the female genital tract: Z87.42

## 2020-08-01 HISTORY — DX: Hypothyroidism, unspecified: E03.9

## 2020-08-01 SURGERY — DILATION AND EVACUATION, UTERUS
Anesthesia: Choice

## 2020-08-01 NOTE — Progress Notes (Signed)
GYN Note  Per OR, patient presented to an OSH miscarrying and will undergo procedure there.  Cornelia Copa MD Attending [facility] [employer] (Faculty Practice) 08/01/2020 Time: 1000am

## 2020-08-02 ENCOUNTER — Telehealth: Payer: Self-pay

## 2020-08-02 NOTE — Telephone Encounter (Signed)
Transition Care Management Follow-up Telephone Call Date of discharge and from where: 08/01/2020-[facility] How have you been since you were released from the hospital? Patient stated she is sick.  Any questions or concerns? No  Items Reviewed: Did the pt receive and understand the discharge instructions provided? Yes  Medications obtained and verified? No  Other? Yes  Any new allergies since your discharge? No  Dietary orders reviewed? N/A Do you have support at home? Yes   Home Care and Equipment/Supplies: Were home health services ordered? not applicable If so, what is the name of the agency? N/A  Has the agency set up a time to come to the patient's home? not applicable Were any new equipment or medical supplies ordered?  No What is the name of the medical supply agency? N/A Were you able to get the supplies/equipment? not applicable Do you have any questions related to the use of the equipment or supplies? No  Functional Questionnaire: (I = Independent and D = Dependent) ADLs: I  Bathing/Dressing- I  Meal Prep- I  Eating- I  Maintaining continence- I  Transferring/Ambulation- I  Managing Meds- I  Follow up appointments reviewed:  PCP Hospital f/u appt confirmed? No   Specialist Hospital f/u appt confirmed? No  patient stated she will call to schedule. Are transportation arrangements needed? No  If their condition worsens, is the pt aware to call PCP or go to the Emergency Dept.? Yes Was the patient provided with contact information for the PCP's office or ED? Yes Was to pt encouraged to call back with questions or concerns? Yes

## 2020-08-03 ENCOUNTER — Telehealth: Payer: Self-pay

## 2020-08-03 ENCOUNTER — Other Ambulatory Visit: Payer: Self-pay

## 2020-08-03 DIAGNOSIS — R58 Hemorrhage, not elsewhere classified: Secondary | ICD-10-CM | POA: Diagnosis not present

## 2020-08-03 DIAGNOSIS — O039 Complete or unspecified spontaneous abortion without complication: Secondary | ICD-10-CM | POA: Diagnosis not present

## 2020-08-03 DIAGNOSIS — R55 Syncope and collapse: Secondary | ICD-10-CM | POA: Diagnosis not present

## 2020-08-03 DIAGNOSIS — R0602 Shortness of breath: Secondary | ICD-10-CM | POA: Diagnosis not present

## 2020-08-03 DIAGNOSIS — O034 Incomplete spontaneous abortion without complication: Secondary | ICD-10-CM | POA: Diagnosis not present

## 2020-08-03 DIAGNOSIS — N939 Abnormal uterine and vaginal bleeding, unspecified: Secondary | ICD-10-CM | POA: Diagnosis not present

## 2020-08-03 DIAGNOSIS — R42 Dizziness and giddiness: Secondary | ICD-10-CM | POA: Diagnosis not present

## 2020-08-03 NOTE — Telephone Encounter (Signed)
Pt called stating she had a SAB a few weeks ago. Pt states she was written a Rx for cytotec and after using it she started bleeding and was shipped to [facility]. Pt was upset about not receiving a call from [facility]. I explained to her that the doctors in the office were not aware that she had been treated at [facility]. The pt is scheduled for a f/u visit in this office on 8/12.  Marwin Primmer l Labrisha Wuellner, CMA

## 2020-08-06 ENCOUNTER — Telehealth: Payer: Self-pay

## 2020-08-06 DIAGNOSIS — R0602 Shortness of breath: Secondary | ICD-10-CM | POA: Diagnosis not present

## 2020-08-06 NOTE — Telephone Encounter (Signed)
Transition Care Management Follow-up Telephone Call Date of discharge and from where: 08/04/2020-[facility]  How have you been since you were released from the hospital? Patient stated she is doing ok.  Any questions or concerns? No  Items Reviewed: Did the pt receive and understand the discharge instructions provided? Yes  Medications obtained and verified? Yes  Other? No  Any new allergies since your discharge? No  Dietary orders reviewed? Yes Do you have support at home? Yes   Home Care and Equipment/Supplies: Were home health services ordered? not applicable If so, what is the name of the agency? N/A  Has the agency set up a time to come to the patient's home? not applicable Were any new equipment or medical supplies ordered?  No What is the name of the medical supply agency? N/A Were you able to get the supplies/equipment? not applicable Do you have any questions related to the use of the equipment or supplies? No  Functional Questionnaire: (I = Independent and D = Dependent) ADLs: I  Bathing/Dressing- I  Meal Prep- I  Eating- I  Maintaining continence- I  Transferring/Ambulation- I  Managing Meds- I  Follow up appointments reviewed:  PCP Hospital f/u appt confirmed? No   Specialist Hospital f/u appt confirmed? Yes  Scheduled to see Dr. Adrian Blackwater on 8/12/202 @ 9:15 am. Are transportation arrangements needed? No  If their condition worsens, is the pt aware to call PCP or go to the Emergency Dept.? Yes Was the patient provided with contact information for the PCP's office or ED? Yes Was to pt encouraged to call back with questions or concerns? Yes

## 2020-08-17 ENCOUNTER — Other Ambulatory Visit: Payer: Self-pay

## 2020-08-17 ENCOUNTER — Encounter: Payer: Self-pay | Admitting: Family Medicine

## 2020-08-17 ENCOUNTER — Ambulatory Visit (INDEPENDENT_AMBULATORY_CARE_PROVIDER_SITE_OTHER): Payer: Medicaid Other | Admitting: Family Medicine

## 2020-08-17 VITALS — BP 132/80 | HR 90 | Ht 71.0 in | Wt 162.0 lb

## 2020-08-17 DIAGNOSIS — O021 Missed abortion: Secondary | ICD-10-CM | POA: Diagnosis not present

## 2020-08-17 DIAGNOSIS — O039 Complete or unspecified spontaneous abortion without complication: Secondary | ICD-10-CM | POA: Diagnosis not present

## 2020-08-17 NOTE — Progress Notes (Signed)
   Subjective:    Patient ID: Victoria Miller, female    DOB: May 05, 1995, 25 y.o.   MRN: [id number]  HPI Patient seen for follow-up of 16-week IUFD.  Patient had a very difficult delivery.  She had been prescribed Cytotec 400 mg to be placed vaginally the night before her D&C.  She ended up going into labor, being taken emergently to [facility] where the delivery was completed.  She had an hemorrhage and needed a D&C and received a unit of blood.  This entire process was traumatic for her.  Her mood has been up and down.  She has been bleeding intermittently.  Patient did have increased Down syndrome risk, which is the likely etiology of the demise.   Review of Systems     Objective:   Physical Exam Vitals reviewed.  Constitutional:      Appearance: Normal appearance.  Cardiovascular:     Rate and Rhythm: Normal rate and regular rhythm.  Pulmonary:     Effort: Pulmonary effort is normal.     Breath sounds: Normal breath sounds.  Skin:    Capillary Refill: Capillary refill takes less than 2 seconds.  Neurological:     General: No focal deficit present.     Mental Status: She is alert.  Psychiatric:        Mood and Affect: Mood normal.        Behavior: Behavior normal.        Thought Content: Thought content normal.       Assessment & Plan:  1. SAB (spontaneous abortion) - CBC - Beta hCG quant (ref lab)  2. IUFD at less than 20 weeks of gestation Discussed timing of next pregnancy -advised the patient to wait 5-6 months before trying to get pregnant again.  Patient is not in a hurry to try to get pregnant.  She is seeing a counselor due to the traumatic events.  3. F/u in 4-6 weeks for TSH and mood check

## 2020-08-17 NOTE — Progress Notes (Signed)
Patient states she is still having bleeding but it is dark in color. Armandina Stammer RN

## 2020-08-18 LAB — CBC
Hematocrit: 26.8 % — ABNORMAL LOW (ref 34.0–46.6)
Hemoglobin: 8.6 g/dL — ABNORMAL LOW (ref 11.1–15.9)
MCH: 29.5 pg (ref 26.6–33.0)
MCHC: 32.1 g/dL (ref 31.5–35.7)
MCV: 92 fL (ref 79–97)
Platelets: 299 10*3/uL (ref 150–450)
RBC: 2.92 x10E6/uL — ABNORMAL LOW (ref 3.77–5.28)
RDW: 13.3 % (ref 11.7–15.4)
WBC: 5.2 10*3/uL (ref 3.4–10.8)

## 2020-08-18 LAB — BETA HCG QUANT (REF LAB): hCG Quant: 172 m[IU]/mL

## 2020-08-22 DIAGNOSIS — F331 Major depressive disorder, recurrent, moderate: Secondary | ICD-10-CM | POA: Diagnosis not present

## 2020-08-23 ENCOUNTER — Encounter: Payer: Medicaid Other | Admitting: Family Medicine

## 2020-08-24 ENCOUNTER — Ambulatory Visit: Payer: Medicaid Other

## 2020-08-27 ENCOUNTER — Other Ambulatory Visit: Payer: Self-pay

## 2020-08-27 ENCOUNTER — Ambulatory Visit (INDEPENDENT_AMBULATORY_CARE_PROVIDER_SITE_OTHER): Payer: Medicaid Other

## 2020-08-27 DIAGNOSIS — O039 Complete or unspecified spontaneous abortion without complication: Secondary | ICD-10-CM

## 2020-08-27 NOTE — Progress Notes (Signed)
Patient sent to lab for hcg. Armandina Stammer RN

## 2020-08-28 ENCOUNTER — Other Ambulatory Visit: Payer: Self-pay | Admitting: Internal Medicine

## 2020-08-28 DIAGNOSIS — E039 Hypothyroidism, unspecified: Secondary | ICD-10-CM

## 2020-08-28 LAB — BETA HCG QUANT (REF LAB): hCG Quant: 35 m[IU]/mL

## 2020-08-28 NOTE — Telephone Encounter (Signed)
  Notes to clinic:   original prescription was discontinued on 09/01/2019 by Rema Fendt, NP for the following reason: Reorder. Renewing this prescription may not be appropriate  Requested Prescriptions  Pending Prescriptions Disp Refills   levothyroxine (SYNTHROID) 112 MCG tablet [Pharmacy Med Name: LEVOTHYROXINE 0.112MG  ( ) TABS] 30 tablet 0    Sig: TAKE 1 TABLET(112 MCG) BY MOUTH DAILY BEFORE BREAKFAST     Endocrinology:  Hypothyroid Agents Failed - 08/28/2020 10:08 AM      Failed - TSH needs to be rechecked within 3 months after an abnormal result. Refill until TSH is due.      Failed - Valid encounter within last 12 months    Recent Outpatient Visits           12 months ago Acquired hypothyroidism   [facility], [place], NP   1 year ago Acquired hypothyroidism   [facility] Marcine Matar, MD   2 years ago Acquired hypothyroidism   [facility] Storm Frisk, MD              Passed - TSH in normal range and within 360 days    TSH  Date Value Ref Range Status  07/26/2020 3.100 0.450 - 4.500 uIU/mL Final

## 2020-08-29 DIAGNOSIS — F331 Major depressive disorder, recurrent, moderate: Secondary | ICD-10-CM | POA: Diagnosis not present

## 2020-09-03 ENCOUNTER — Ambulatory Visit: Payer: Medicaid Other

## 2020-09-08 ENCOUNTER — Other Ambulatory Visit: Payer: Self-pay | Admitting: Internal Medicine

## 2020-09-08 DIAGNOSIS — E039 Hypothyroidism, unspecified: Secondary | ICD-10-CM

## 2020-09-10 MED ORDER — LEVOTHYROXINE SODIUM 125 MCG PO TABS: 125.0000 ug | ORAL_TABLET | Freq: Every day | ORAL | 0 refills | Status: DC

## 2020-09-12 ENCOUNTER — Ambulatory Visit: Payer: Medicaid Other | Admitting: Family Medicine

## 2020-09-21 DIAGNOSIS — F331 Major depressive disorder, recurrent, moderate: Secondary | ICD-10-CM | POA: Diagnosis not present

## 2020-10-05 DIAGNOSIS — F331 Major depressive disorder, recurrent, moderate: Secondary | ICD-10-CM | POA: Diagnosis not present

## 2020-10-08 DIAGNOSIS — F331 Major depressive disorder, recurrent, moderate: Secondary | ICD-10-CM | POA: Diagnosis not present

## 2020-10-15 DIAGNOSIS — F331 Major depressive disorder, recurrent, moderate: Secondary | ICD-10-CM | POA: Diagnosis not present

## 2020-10-18 DIAGNOSIS — F4320 Adjustment disorder, unspecified: Secondary | ICD-10-CM | POA: Diagnosis not present

## 2020-10-22 DIAGNOSIS — F331 Major depressive disorder, recurrent, moderate: Secondary | ICD-10-CM | POA: Diagnosis not present

## 2020-10-23 ENCOUNTER — Telehealth: Payer: Self-pay

## 2020-10-23 NOTE — Telephone Encounter (Signed)
..  Patient declines further follow up and engagement by the Managed Medicaid Team. Appropriate care team members and provider have been notified via electronic communication. The Managed Medicaid Team is available to follow up with the patient after provider conversation with the patient regarding recommendation for engagement and subsequent re-referral to the Managed Medicaid Team.    Jennifer Alley Care Guide, High Risk Medicaid Managed Care Embedded Care Coordination [place]  [facility]   

## 2020-10-29 DIAGNOSIS — F331 Major depressive disorder, recurrent, moderate: Secondary | ICD-10-CM | POA: Diagnosis not present

## 2020-11-05 DIAGNOSIS — F331 Major depressive disorder, recurrent, moderate: Secondary | ICD-10-CM | POA: Diagnosis not present

## 2020-11-12 DIAGNOSIS — F331 Major depressive disorder, recurrent, moderate: Secondary | ICD-10-CM | POA: Diagnosis not present

## 2020-11-19 DIAGNOSIS — F331 Major depressive disorder, recurrent, moderate: Secondary | ICD-10-CM | POA: Diagnosis not present

## 2020-11-26 ENCOUNTER — Other Ambulatory Visit: Payer: Self-pay | Admitting: Internal Medicine

## 2020-11-26 DIAGNOSIS — E039 Hypothyroidism, unspecified: Secondary | ICD-10-CM

## 2020-11-26 DIAGNOSIS — F331 Major depressive disorder, recurrent, moderate: Secondary | ICD-10-CM | POA: Diagnosis not present

## 2020-12-10 DIAGNOSIS — F331 Major depressive disorder, recurrent, moderate: Secondary | ICD-10-CM | POA: Diagnosis not present

## 2020-12-12 ENCOUNTER — Other Ambulatory Visit: Payer: Self-pay | Admitting: Internal Medicine

## 2020-12-12 DIAGNOSIS — E039 Hypothyroidism, unspecified: Secondary | ICD-10-CM

## 2020-12-13 ENCOUNTER — Other Ambulatory Visit: Payer: Self-pay

## 2020-12-13 ENCOUNTER — Ambulatory Visit: Payer: Medicaid Other | Attending: Family Medicine | Admitting: Family Medicine

## 2020-12-13 ENCOUNTER — Encounter: Payer: Self-pay | Admitting: Family Medicine

## 2020-12-13 VITALS — BP 123/87 | HR 69 | Ht 71.0 in | Wt 168.8 lb

## 2020-12-13 DIAGNOSIS — D649 Anemia, unspecified: Secondary | ICD-10-CM

## 2020-12-13 DIAGNOSIS — E039 Hypothyroidism, unspecified: Secondary | ICD-10-CM | POA: Diagnosis not present

## 2020-12-13 MED ORDER — LEVOTHYROXINE SODIUM 125 MCG PO TABS: 125.0000 ug | ORAL_TABLET | Freq: Every day | ORAL | 6 refills | Status: DC

## 2020-12-13 NOTE — Progress Notes (Signed)
Subjective:  Patient ID: Victoria Miller, female    DOB: 06/15/1995  Age: 25 y.o. MRN: [id number]  CC: Hypothyroidism   HPI Victoria Miller is a 25 y.o. year old female patient of Dr. Laural Benes with a history of hypothyroidism here for chronic disease management.  Interval History: She had a SAB in 08/2020 and is followed by OBGYN.  Also undergoing counseling and endorses that she has had good days and bad days.  She has a 25-year-old child at home who is a source of joy to her. She endorses nonadherence with her levothyroxine for treatment of hypothyroidism especially after her spontaneous abortion due to her emotions being all over the place but promises to be more adherent going forward.  Last TSH in 07/2020 was normal. Denies acute concerns today.  Past Medical History:  Diagnosis Date   History of ovarian cyst    Hypothyroidism    followed by pcp   Ruptured ovarian cyst 01/08/2018   Sickle cell trait (HCC)     Past Surgical History:  Procedure Laterality Date   NO PAST SURGERIES      Family History  Problem Relation Age of Onset   Heart disease Mother    Arthritis Mother     No Known Allergies  Outpatient Medications Prior to Visit  Medication Sig Dispense Refill   ibuprofen (ADVIL) 800 MG tablet Take 800 mg by mouth every 8 (eight) hours as needed. (Patient not taking: Reported on 12/13/2020)     Prenat-FeAsp-Meth-FA-DHA w/o A (PRENATE PIXIE) 10-0.6-0.4-200 MG CAPS Take 1 capsule by mouth daily. (Patient not taking: Reported on 12/13/2020) 9 capsule 0   levothyroxine (SYNTHROID) 125 MCG tablet Take 1 tablet (125 mcg total) by mouth daily before breakfast for 15 days. Need to be seen by primary doctor prior to next refill request. 15 tablet 0   No facility-administered medications prior to visit.     ROS Review of Systems  Constitutional:  Negative for activity change, appetite change and fatigue.  HENT:  Negative for congestion, sinus pressure and sore throat.   Eyes:   Negative for visual disturbance.  Respiratory:  Negative for cough, chest tightness, shortness of breath and wheezing.   Cardiovascular:  Negative for chest pain and palpitations.  Gastrointestinal:  Negative for abdominal distention, abdominal pain and constipation.  Endocrine: Negative for polydipsia.  Genitourinary:  Negative for dysuria and frequency.  Musculoskeletal:  Negative for arthralgias and back pain.  Skin:  Negative for rash.  Neurological:  Negative for tremors, light-headedness and numbness.  Hematological:  Does not bruise/bleed easily.  Psychiatric/Behavioral:  Negative for agitation and behavioral problems.    Objective:  BP 123/87   Pulse 69   Ht 5\' 11"  (1.803 m)   Wt 168 lb 12.8 oz (76.6 kg)   SpO2 99%   BMI 23.54 kg/m   BP/Weight 12/13/2020 08/17/2020 08/01/2020  Systolic BP 123 132 -  Diastolic BP 87 80 -  Wt. (Lbs) 168.8 162 -  BMI 23.54 22.59 23.71      Physical Exam Constitutional:      Appearance: She is well-developed.  Cardiovascular:     Rate and Rhythm: Normal rate.     Heart sounds: Normal heart sounds. No murmur heard. Pulmonary:     Effort: Pulmonary effort is normal.     Breath sounds: Normal breath sounds. No wheezing or rales.  Chest:     Chest wall: No tenderness.  Abdominal:     General: Bowel sounds are normal. There is  no distension.     Palpations: Abdomen is soft. There is no mass.     Tenderness: There is no abdominal tenderness.  Musculoskeletal:        General: Normal range of motion.     Right lower leg: No edema.     Left lower leg: No edema.  Neurological:     Mental Status: She is alert and oriented to person, place, and time.  Psychiatric:        Mood and Affect: Mood normal.    CMP Latest Ref Rng & Units 07/29/2020 07/26/2020 02/29/2020  Glucose 70 - 99 mg/dL 88 84 89  BUN 6 - 20 mg/dL 9 10 11   Creatinine 0.44 - 1.00 mg/dL 2.69 4.85  Sodium 135 - 145 mmol/L 135 137 138  Potassium 3.5 - 5.1 mmol/L 4.1 3.8  4.0  Chloride 98 - 111 mmol/L 105 99 103  CO2 22 - 32 mmol/L 22 22 26   Calcium 8.9 - 10.3 mg/dL 9.4 9.3 9.4  Total Protein 6.5 - 8.1 g/dL 6.8 6.9 7.2  Total Bilirubin 0.3 - 1.2 mg/dL 0.6 0.3 0.7  Alkaline Phos 38 - 126 U/L 29(L) 37(L) 38  AST 15 - 41 U/L 20 19 23   ALT 0 - 44 U/L 12 10 15     Lipid Panel  No results found for: CHOL, TRIG, HDL, CHOLHDL, VLDL, LDLCALC, LDLDIRECT  CBC    Component Value Date/Time   WBC 5.2 08/17/2020 1059   WBC 7.4 07/29/2020 1548   RBC 2.92 (L) 08/17/2020 1059   RBC 3.79 (L) 07/29/2020 1548   HGB 8.6 (L) 08/17/2020 1059   HCT 26.8 (L) 08/17/2020 1059   PLT 299 08/17/2020 1059   MCV 92 08/17/2020 1059   MCH 29.5 08/17/2020 1059   MCH 30.3 07/29/2020 1548   MCHC 32.1 08/17/2020 1059   MCHC 34.8 07/29/2020 1548   RDW 13.3 08/17/2020 1059   LYMPHSABS 1.6 06/26/2020 1037   MONOABS 0.6 12/27/2017 1544   EOSABS 0.1 06/26/2020 1037   BASOSABS 0.0 06/26/2020 1037    No results found for: HGBA1C  Lab Results  Component Value Date   TSH 3.100 07/26/2020    Assessment & Plan:  1. Acquired hypothyroidism Will refill her levothyroxine and hold off on checking thyroid panel until 2 weeks since she has not been totally adherent with her levothyroxine - levothyroxine (SYNTHROID) 125 MCG tablet; Take 1 tablet (125 mcg total) by mouth daily before breakfast for 15 days.  Dispense: 30 tablet; Refill: 6 - T4, free; Future - TSH  2. Anemia, unspecified type Last hemoglobin was 8.6 in 08/2020 Will check CBC She is currently on ferrous sulfate - CBC with Differential/Platelet; Future   Meds ordered this encounter  Medications   levothyroxine (SYNTHROID) 125 MCG tablet    Sig: Take 1 tablet (125 mcg total) by mouth daily before breakfast for 15 days.    Dispense:  30 tablet    Refill:  6     Follow-up: Return in about 6 months (around 06/13/2021) for Medical conditions with PCP.       06/28/2020, MD, FAAFP. [facility] [place], 08/13/2021 Hoy Register   12/13/2020, 12:31 PM

## 2020-12-17 DIAGNOSIS — F331 Major depressive disorder, recurrent, moderate: Secondary | ICD-10-CM | POA: Diagnosis not present

## 2020-12-24 DIAGNOSIS — F331 Major depressive disorder, recurrent, moderate: Secondary | ICD-10-CM | POA: Diagnosis not present

## 2020-12-25 DIAGNOSIS — N898 Other specified noninflammatory disorders of vagina: Secondary | ICD-10-CM | POA: Diagnosis not present

## 2021-01-08 DIAGNOSIS — F331 Major depressive disorder, recurrent, moderate: Secondary | ICD-10-CM | POA: Diagnosis not present

## 2021-01-14 DIAGNOSIS — F331 Major depressive disorder, recurrent, moderate: Secondary | ICD-10-CM | POA: Diagnosis not present

## 2021-02-05 DIAGNOSIS — F331 Major depressive disorder, recurrent, moderate: Secondary | ICD-10-CM | POA: Diagnosis not present

## 2021-02-26 ENCOUNTER — Ambulatory Visit: Payer: Medicaid Other | Admitting: Internal Medicine

## 2021-04-11 ENCOUNTER — Encounter: Payer: Self-pay | Admitting: Family Medicine

## 2021-04-11 DIAGNOSIS — E039 Hypothyroidism, unspecified: Secondary | ICD-10-CM

## 2021-04-11 MED ORDER — LEVOTHYROXINE SODIUM 125 MCG PO TABS: 125.0000 ug | ORAL_TABLET | Freq: Every day | ORAL | 3 refills | Status: DC

## 2021-04-16 DIAGNOSIS — O26891 Other specified pregnancy related conditions, first trimester: Secondary | ICD-10-CM | POA: Diagnosis not present

## 2021-04-16 DIAGNOSIS — Z3A01 Less than 8 weeks gestation of pregnancy: Secondary | ICD-10-CM | POA: Diagnosis not present

## 2021-04-16 DIAGNOSIS — R102 Pelvic and perineal pain: Secondary | ICD-10-CM | POA: Diagnosis not present

## 2021-04-16 DIAGNOSIS — R109 Unspecified abdominal pain: Secondary | ICD-10-CM | POA: Diagnosis not present

## 2021-04-17 ENCOUNTER — Telehealth: Payer: Self-pay

## 2021-04-17 NOTE — Telephone Encounter (Signed)
Transition Care Management Follow-up Telephone Call ?Date of discharge and from where: 04/16/2021-[facility]  ?How have you been since you were released from the hospital? Pt stated she is doing fine.  ?Any questions or concerns? No ? ?Items Reviewed: ?Did the pt receive and understand the discharge instructions provided? Yes  ?Medications obtained and verified? Yes  ?Other? No  ?Any new allergies since your discharge? No  ?Dietary orders reviewed? No ?Do you have support at home? Yes  ? ?Home Care and Equipment/Supplies: ?Were home health services ordered? not applicable ?If so, what is the name of the agency? N/A  ?Has the agency set up a time to come to the patient's home? not applicable ?Were any new equipment or medical supplies ordered?  No ?What is the name of the medical supply agency? N/A ?Were you able to get the supplies/equipment? not applicable ?Do you have any questions related to the use of the equipment or supplies? No ? ?Functional Questionnaire: (I = Independent and D = Dependent) ?ADLs: I ? ?Bathing/Dressing- I ? ?Meal Prep- I ? ?Eating- I ? ?Maintaining continence- I ? ?Transferring/Ambulation- I ? ?Managing Meds- I ? ?Follow up appointments reviewed: ? ?PCP Hospital f/u appt confirmed? No   ?Specialist Hospital f/u appt confirmed? No   ?Are transportation arrangements needed? No  ?If their condition worsens, is the pt aware to call PCP or go to the Emergency Dept.? Yes ?Was the patient provided with contact information for the PCP's office or ED? Yes ?Was to pt encouraged to call back with questions or concerns? Yes  ?

## 2021-04-19 DIAGNOSIS — F331 Major depressive disorder, recurrent, moderate: Secondary | ICD-10-CM | POA: Diagnosis not present

## 2021-04-23 DIAGNOSIS — Z3A01 Less than 8 weeks gestation of pregnancy: Secondary | ICD-10-CM | POA: Diagnosis not present

## 2021-04-23 DIAGNOSIS — O208 Other hemorrhage in early pregnancy: Secondary | ICD-10-CM | POA: Diagnosis not present

## 2021-04-23 DIAGNOSIS — O209 Hemorrhage in early pregnancy, unspecified: Secondary | ICD-10-CM | POA: Diagnosis not present

## 2021-04-24 ENCOUNTER — Telehealth: Payer: Self-pay

## 2021-04-24 NOTE — Telephone Encounter (Signed)
Transition Care Management Follow-up Telephone Call ?Date of discharge and from where: 04/23/2021-[facility]  ?How have you been since you were released from the hospital? Pt stated she's doing good.  ?Any questions or concerns? No ? ?Items Reviewed: ?Did the pt receive and understand the discharge instructions provided? Yes  ?Medications obtained and verified?  No medications given at discharge  ?Other? No  ?Any new allergies since your discharge? No  ?Dietary orders reviewed? No ?Do you have support at home? Yes  ? ?Home Care and Equipment/Supplies: ?Were home health services ordered? not applicable ?If so, what is the name of the agency? N/A  ?Has the agency set up a time to come to the patient's home? not applicable ?Were any new equipment or medical supplies ordered?  No ?What is the name of the medical supply agency? N/A ?Were you able to get the supplies/equipment? not applicable ?Do you have any questions related to the use of the equipment or supplies? No ? ?Functional Questionnaire: (I = Independent and D = Dependent) ?ADLs: I ? ?Bathing/Dressing- I ? ?Meal Prep- I ? ?Eating- I ? ?Maintaining continence- I ? ?Transferring/Ambulation- I ? ?Managing Meds- I ? ?Follow up appointments reviewed: ? ?PCP Hospital f/u appt confirmed? No   ?Specialist Hospital f/u appt confirmed? No   ?Are transportation arrangements needed? No  ?If their condition worsens, is the pt aware to call PCP or go to the Emergency Dept.? Yes ?Was the patient provided with contact information for the PCP's office or ED? Yes ?Was to pt encouraged to call back with questions or concerns? Yes  ?

## 2021-04-26 DIAGNOSIS — F331 Major depressive disorder, recurrent, moderate: Secondary | ICD-10-CM | POA: Diagnosis not present

## 2021-04-30 MED ORDER — CYANOCOBALAMIN 250 MCG PO TABS: 250.0000 ug | ORAL_TABLET | Freq: Every day | ORAL | 3 refills | Status: DC

## 2021-04-30 MED ORDER — MAGNESIUM GLYCINATE 100 MG PO CAPS: 2.0000 | ORAL_CAPSULE | Freq: Every day | ORAL | 6 refills | Status: DC

## 2021-04-30 NOTE — Addendum Note (Signed)
Addended by: Levie Heritage on: 04/30/2021 04:13 PM ? ? Modules accepted: Orders ? ?

## 2021-05-02 ENCOUNTER — Ambulatory Visit (INDEPENDENT_AMBULATORY_CARE_PROVIDER_SITE_OTHER): Payer: Medicaid Other | Admitting: Family Medicine

## 2021-05-02 ENCOUNTER — Encounter: Payer: Self-pay | Admitting: Family Medicine

## 2021-05-02 VITALS — BP 117/76 | HR 72 | Wt 165.0 lb

## 2021-05-02 DIAGNOSIS — O039 Complete or unspecified spontaneous abortion without complication: Secondary | ICD-10-CM | POA: Diagnosis not present

## 2021-05-02 DIAGNOSIS — O021 Missed abortion: Secondary | ICD-10-CM

## 2021-05-02 DIAGNOSIS — Z3A08 8 weeks gestation of pregnancy: Secondary | ICD-10-CM | POA: Diagnosis not present

## 2021-05-02 DIAGNOSIS — O209 Hemorrhage in early pregnancy, unspecified: Secondary | ICD-10-CM | POA: Diagnosis not present

## 2021-05-02 NOTE — Progress Notes (Signed)
? ?  Subjective:  ? ? Patient ID: Victoria Miller, female    DOB: Aug 05, 1995, 26 y.o.   MRN: [id number] ? ?HPI ? ?Patient seen due to vaginal bleeding.  [facility] and was diagnosed with a missed AB.  She denies cramping. ? ?She has a history of missed AB last year.  She was given Cytotec, but had extremely heavy bleeding following and ended up needing a blood transfusion due to the heavy bleeding.  She would prefer to not go through a similar experience and would prefer to just have a D&E. ? ?Review of Systems ? ?   ?Objective:  ? Physical Exam ?Vitals reviewed.  ?Constitutional:   ?   Appearance: Normal appearance.  ?Cardiovascular:  ?   Rate and Rhythm: Normal rate and regular rhythm.  ?Skin: ?   Capillary Refill: Capillary refill takes less than 2 seconds.  ?Neurological:  ?   General: No focal deficit present.  ?   Mental Status: She is alert.  ?Psychiatric:     ?   Mood and Affect: Mood normal.     ?   Behavior: Behavior normal.     ?   Thought Content: Thought content normal.  ? ? ?   ?Assessment & Plan:  ?1. Missed ab ?Quick US done - confirmed no cardiac activity. Will schedule for D&E.  ?Patient scheduled for follow up appt following D&E ? ?

## 2021-05-03 ENCOUNTER — Telehealth: Payer: Self-pay

## 2021-05-03 DIAGNOSIS — O021 Missed abortion: Secondary | ICD-10-CM | POA: Diagnosis present

## 2021-05-03 NOTE — Telephone Encounter (Signed)
Called patient, no answer, left voicemail with surgery date, time and preop instructions. ?

## 2021-05-06 ENCOUNTER — Encounter (HOSPITAL_COMMUNITY): Payer: Self-pay | Admitting: Obstetrics & Gynecology

## 2021-05-06 ENCOUNTER — Other Ambulatory Visit: Payer: Self-pay

## 2021-05-06 NOTE — Pre-Procedure Instructions (Signed)
SDW CALL ? ?Patient was given pre-op instructions over the phone. The opportunity was given for the patient to ask questions. No further questions asked. Patient verbalized understanding of instructions given. ? ? ?PCP - Jonah Blue, MD ?Cardiologist - denies ? ?PPM/ICD - denies ?Chest x-ray - denies ?EKG - denies ?Stress Test - denies ?ECHO - denies ?Cardiac Cath - denies ? ?Sleep Study - denies ?Aspirin Instructions: ? ?ERAS Protcol -order ?PRE-SURGERY Ensure or G2- none ? ?COVID TEST- N/A ? ?Anesthesia review: N/A ? ?Patient denies shortness of breath, fever, cough and chest pain over the phone call ? ? ? ?Surgical Instructions ? ? ? Your procedure is scheduled on Tuesday, May 2 ? Report to [facility] Main Entrance "A" at 12:30 P.M., then check in with the Admitting office. ? Call this number if you have problems the morning of surgery: ? [phone] ? ? ? Remember: ? Do not eat after midnight the night before your surgery ? ?You may drink clear liquids until 12:00PM the morning of your surgery.   ?Clear liquids allowed are: Water, Non-Citrus Juices (without pulp), Carbonated Beverages, Clear Tea, Black Coffee ONLY (NO MILK, CREAM OR POWDERED CREAMER of any kind), and Gatorade ? ? Take these medicines the morning of surgery with A SIP OF WATER:  ? ?levothyroxine (SYNTHROID) ? ?As of today, STOP taking any Aspirin (unless otherwise instructed by your surgeon) Aleve, Naproxen, Ibuprofen, Motrin, Advil, Goody's, BC's, all herbal medications, fish oil, and all vitamins. ? ? is not responsible for any belongings or valuables.   ?  ?Contacts, glasses, hearing aids, dentures or partials may not be worn into surgery, please bring cases for these belongings ?  ?Patients discharged the day of surgery will not be allowed to drive home, and someone needs to stay with them for 24 hours. ? ?SURGICAL WAITING ROOM VISITATION ?No visitors are allowed in pre-op area with patient.  ?Patients having surgery or a  procedure in a hospital may have two support people in the waiting room. ?Children under the age of 35 must have an adult with them who is not the patient. ?They may stay in the waiting area during the procedure and may switch out with other visitors. If the patient needs to stay at the hospital during part of their recovery, the visitor guidelines for inpatient rooms apply. ? ?Please refer to the [place] website for the visitor guidelines for Inpatients (after your surgery is over and you are in a regular room).  ? ? ?Special instructions:   ? ?Oral Hygiene is also important to reduce your risk of infection.  Remember - BRUSH YOUR TEETH THE MORNING OF SURGERY WITH YOUR REGULAR TOOTHPASTE ? ? ?Day of Surgery: ? ?Take a shower the day of or night before with antibacterial soap. ?Wear Clean/Comfortable clothing the morning of surgery ?Do not apply any deodorants/lotions.   ?Do not wear jewelry or makeup ?Do not wear lotions, powders, perfumes/colognes, or deodorant. ?Do not shave 48 hours prior to surgery.  Men may shave face and neck. ?Do not bring valuables to the hospital. ?Do not wear nail polish, gel polish, artificial nails, or any other type of covering on natural nails (fingers and toes) ?If you have artificial nails or gel coating that need to be removed by a nail salon, please have this removed prior to surgery. Artificial nails or gel coating may interfere with anesthesia's ability to adequately monitor your vital signs. ?Remember to brush your teeth WITH YOUR REGULAR TOOTHPASTE. ? ? ? ? ? ?

## 2021-05-07 ENCOUNTER — Other Ambulatory Visit: Payer: Self-pay

## 2021-05-07 ENCOUNTER — Encounter (HOSPITAL_COMMUNITY)
Admission: RE | Disposition: A | Discharge status: Home / Self Care | Payer: Self-pay | Source: Home / Self Care | Attending: Obstetrics & Gynecology

## 2021-05-07 ENCOUNTER — Encounter (HOSPITAL_COMMUNITY): Payer: Self-pay | Admitting: Obstetrics & Gynecology

## 2021-05-07 ENCOUNTER — Ambulatory Visit (HOSPITAL_BASED_OUTPATIENT_CLINIC_OR_DEPARTMENT_OTHER): Payer: Medicaid Other | Admitting: Anesthesiology

## 2021-05-07 ENCOUNTER — Ambulatory Visit (HOSPITAL_COMMUNITY): Payer: Medicaid Other

## 2021-05-07 ENCOUNTER — Ambulatory Visit (HOSPITAL_COMMUNITY): Payer: Medicaid Other | Admitting: Anesthesiology

## 2021-05-07 ENCOUNTER — Ambulatory Visit (HOSPITAL_COMMUNITY)
Admission: RE | Admit: 2021-05-07 | Discharge: 2021-05-07 | Disposition: A | Discharge status: Home / Self Care | Payer: Medicaid Other | Attending: Obstetrics & Gynecology | Admitting: Obstetrics & Gynecology

## 2021-05-07 DIAGNOSIS — R569 Unspecified convulsions: Secondary | ICD-10-CM

## 2021-05-07 DIAGNOSIS — R259 Unspecified abnormal involuntary movements: Secondary | ICD-10-CM

## 2021-05-07 DIAGNOSIS — Z3A08 8 weeks gestation of pregnancy: Secondary | ICD-10-CM

## 2021-05-07 DIAGNOSIS — Z79890 Hormone replacement therapy (postmenopausal): Secondary | ICD-10-CM | POA: Insufficient documentation

## 2021-05-07 DIAGNOSIS — D573 Sickle-cell trait: Secondary | ICD-10-CM | POA: Diagnosis present

## 2021-05-07 DIAGNOSIS — O021 Missed abortion: Secondary | ICD-10-CM | POA: Diagnosis not present

## 2021-05-07 DIAGNOSIS — E039 Hypothyroidism, unspecified: Secondary | ICD-10-CM | POA: Insufficient documentation

## 2021-05-07 HISTORY — DX: Personal history of other medical treatment: Z92.89

## 2021-05-07 HISTORY — PX: DILATION AND EVACUATION: SHX1459

## 2021-05-07 LAB — CBC
HCT: 35 % — ABNORMAL LOW (ref 36.0–46.0)
Hemoglobin: 12.3 g/dL (ref 12.0–15.0)
MCH: 30.8 pg (ref 26.0–34.0)
MCHC: 35.1 g/dL (ref 30.0–36.0)
MCV: 87.7 fL (ref 80.0–100.0)
Platelets: 216 10*3/uL (ref 150–400)
RBC: 3.99 MIL/uL (ref 3.87–5.11)
RDW: 12.6 % (ref 11.5–15.5)
WBC: 4.9 10*3/uL (ref 4.0–10.5)
nRBC: 0 % (ref 0.0–0.2)

## 2021-05-07 LAB — BASIC METABOLIC PANEL
Anion gap: 16 — ABNORMAL HIGH (ref 5–15)
BUN: 9 mg/dL (ref 6–20)
CO2: 16 mmol/L — ABNORMAL LOW (ref 22–32)
Calcium: 9.6 mg/dL (ref 8.9–10.3)
Chloride: 107 mmol/L (ref 98–111)
Creatinine, Ser: 0.81 mg/dL (ref 0.44–1.00)
GFR, Estimated: >60 mL/min (ref >60)
Glucose, Bld: 83 mg/dL (ref 70–99)
Potassium: 4.7 mmol/L (ref 3.5–5.1)
Sodium: 139 mmol/L (ref 135–145)

## 2021-05-07 LAB — GLUCOSE, CAPILLARY: Glucose-Capillary: 76 mg/dL (ref 70–99)

## 2021-05-07 SURGERY — DILATION AND EVACUATION, UTERUS
Anesthesia: General

## 2021-05-07 MED ORDER — BUPIVACAINE HCL (PF) 0.5 % IJ SOLN
INTRAMUSCULAR | Status: DC | PRN
  Administered 2021-05-07: 20 mL

## 2021-05-07 MED ORDER — DEXAMETHASONE SODIUM PHOSPHATE 10 MG/ML IJ SOLN
INTRAMUSCULAR | Status: AC
  Filled 2021-05-07: qty 1

## 2021-05-07 MED ORDER — MIDAZOLAM HCL 2 MG/2ML IJ SOLN
INTRAMUSCULAR | Status: AC
  Filled 2021-05-07: qty 2

## 2021-05-07 MED ORDER — MIDAZOLAM HCL 2 MG/2ML IJ SOLN
INTRAMUSCULAR | Status: AC
  Administered 2021-05-07: 0.5 mg
  Administered 2021-05-07: 0.5 mg via INTRAVENOUS
  Filled 2021-05-07: qty 2

## 2021-05-07 MED ORDER — LACTATED RINGERS IV SOLN: INTRAVENOUS | Status: DC

## 2021-05-07 MED ORDER — PROPOFOL 10 MG/ML IV BOLUS
INTRAVENOUS | Status: AC
  Filled 2021-05-07: qty 20

## 2021-05-07 MED ORDER — ORAL CARE MOUTH RINSE: 15.0000 mL | Freq: Once | OROMUCOSAL | Status: AC

## 2021-05-07 MED ORDER — LIDOCAINE 2% (20 MG/ML) 5 ML SYRINGE
INTRAMUSCULAR | Status: AC
  Filled 2021-05-07: qty 5

## 2021-05-07 MED ORDER — FLUMAZENIL 0.5 MG/5ML IV SOLN
INTRAVENOUS | Status: DC
Start: 2021-05-07 — End: 2021-05-07
  Filled 2021-05-07: qty 5

## 2021-05-07 MED ORDER — IBUPROFEN 600 MG PO TABS: 600.0000 mg | ORAL_TABLET | Freq: Four times a day (QID) | ORAL | 0 refills | Status: DC | PRN

## 2021-05-07 MED ORDER — 0.9 % SODIUM CHLORIDE (POUR BTL) OPTIME
TOPICAL | Status: DC | PRN
  Administered 2021-05-07: 1000 mL

## 2021-05-07 MED ORDER — FAT EMULSION PLANT BASED 20% (INTRALIPID)IV EMUL
INTRAVENOUS | Status: AC
  Administered 2021-05-07: 50 g
  Filled 2021-05-07: qty 250

## 2021-05-07 MED ORDER — ONDANSETRON HCL 4 MG/2ML IJ SOLN
INTRAMUSCULAR | Status: AC
  Filled 2021-05-07: qty 2

## 2021-05-07 MED ORDER — NALOXONE HCL 0.4 MG/ML IJ SOLN
INTRAMUSCULAR | Status: AC
  Filled 2021-05-07: qty 2

## 2021-05-07 MED ORDER — CHLORHEXIDINE GLUCONATE 0.12 % MT SOLN: 15.0000 mL | Freq: Once | OROMUCOSAL | Status: AC

## 2021-05-07 MED ORDER — FENTANYL CITRATE (PF) 250 MCG/5ML IJ SOLN
INTRAMUSCULAR | Status: DC | PRN
Start: 2021-05-07 — End: 2021-05-07
  Administered 2021-05-07 (×2): 25 ug via INTRAVENOUS

## 2021-05-07 MED ORDER — PROPOFOL 10 MG/ML IV BOLUS
INTRAVENOUS | Status: DC | PRN
  Administered 2021-05-07: 170 mg via INTRAVENOUS

## 2021-05-07 MED ORDER — FAT EMULSION 20 % BOLUS-LOCAL ANES INDUCED CARDIAC ARREST - OPTIME
INTRAVENOUS | Status: DC | PRN
  Administered 2021-05-07: 250 mL via INTRAVENOUS

## 2021-05-07 MED ORDER — LIDOCAINE 2% (20 MG/ML) 5 ML SYRINGE
INTRAMUSCULAR | Status: DC | PRN
  Administered 2021-05-07: 100 mg via INTRAVENOUS

## 2021-05-07 MED ORDER — CHLORHEXIDINE GLUCONATE 0.12 % MT SOLN
OROMUCOSAL | Status: AC
  Administered 2021-05-07: 15 mL via OROMUCOSAL
  Filled 2021-05-07: qty 15

## 2021-05-07 MED ORDER — FAT EMULSION PLANT BASED 20% (INTRALIPID)IV EMUL
INTRAVENOUS | Status: AC
  Filled 2021-05-07: qty 250

## 2021-05-07 MED ORDER — BUPIVACAINE HCL (PF) 0.5 % IJ SOLN
INTRAMUSCULAR | Status: AC
  Filled 2021-05-07: qty 30

## 2021-05-07 MED ORDER — FENTANYL CITRATE (PF) 250 MCG/5ML IJ SOLN
INTRAMUSCULAR | Status: AC
  Filled 2021-05-07: qty 5

## 2021-05-07 MED ORDER — MIDAZOLAM HCL 2 MG/2ML IJ SOLN
INTRAMUSCULAR | Status: DC | PRN
  Administered 2021-05-07: 2 mg via INTRAVENOUS

## 2021-05-07 MED ORDER — ONDANSETRON HCL 4 MG/2ML IJ SOLN
INTRAMUSCULAR | Status: DC | PRN
  Administered 2021-05-07: 4 mg via INTRAVENOUS

## 2021-05-07 MED ORDER — DEXAMETHASONE SODIUM PHOSPHATE 10 MG/ML IJ SOLN
INTRAMUSCULAR | Status: DC | PRN
  Administered 2021-05-07: 10 mg via INTRAVENOUS

## 2021-05-07 SURGICAL SUPPLY — 24 items
CATH FILIFORM SPIRAL TIP 6FR (CATHETERS) ×1 IMPLANT
CATH ROBINSON RED A/P 16FR (CATHETERS) ×2 IMPLANT
DECANTER SPIKE VIAL GLASS SM (MISCELLANEOUS) ×2 IMPLANT
FILTER UTR ASPR ASSEMBLY (MISCELLANEOUS) ×2 IMPLANT
GLOVE BIOGEL PI IND STRL 7.0 (GLOVE) ×2 IMPLANT
GLOVE BIOGEL PI INDICATOR 7.0 (GLOVE) ×2
GLOVE ECLIPSE 7.0 STRL STRAW (GLOVE) ×2 IMPLANT
GOWN STRL REUS W/ TWL LRG LVL3 (GOWN DISPOSABLE) ×1 IMPLANT
GOWN STRL REUS W/ TWL XL LVL3 (GOWN DISPOSABLE) ×1 IMPLANT
GOWN STRL REUS W/TWL LRG LVL3 (GOWN DISPOSABLE) ×1
GOWN STRL REUS W/TWL XL LVL3 (GOWN DISPOSABLE) ×1
HOSE CONNECTING 18IN BERKELEY (TUBING) ×2 IMPLANT
KIT BERKELEY 1ST TRI 3/8 NO TR (MISCELLANEOUS) ×2 IMPLANT
KIT BERKELEY 1ST TRIMESTER 3/8 (MISCELLANEOUS) ×2 IMPLANT
NS IRRIG 1000ML POUR BTL (IV SOLUTION) ×2 IMPLANT
PACK VAGINAL MINOR WOMEN LF (CUSTOM PROCEDURE TRAY) ×2 IMPLANT
PAD OB MATERNITY 4.3X12.25 (PERSONAL CARE ITEMS) ×2 IMPLANT
SET BERKELEY SUCTION TUBING (SUCTIONS) ×2 IMPLANT
TOWEL GREEN STERILE FF (TOWEL DISPOSABLE) ×4 IMPLANT
UNDERPAD 30X36 HEAVY ABSORB (UNDERPADS AND DIAPERS) ×2 IMPLANT
VACURETTE 10 RIGID CVD (CANNULA) IMPLANT
VACURETTE 7MM CVD STRL WRAP (CANNULA) IMPLANT
VACURETTE 8 RIGID CVD (CANNULA) IMPLANT
VACURETTE 9 RIGID CVD (CANNULA) IMPLANT

## 2021-05-07 NOTE — Code Documentation (Signed)
Code stroke alert called for this patient in PACU. Pt post gyn procedure, began bilateral convulsions. CBG, BP, airway, vital signs all stable. Clinical exam not consistent with stroke per Dr Leonel Ramsay. Further workup underway to evaluate convulsions. Code stroke cancelled at 1608. ?

## 2021-05-07 NOTE — Brief Op Note (Signed)
05/07/2021 ? ?3:47 PM ? ?PATIENT:  Victoria Miller  26 y.o. female ? ?PRE-OPERATIVE DIAGNOSIS:  Missed Abortion ? ?POST-OPERATIVE DIAGNOSIS:  Missed Abortion ? ?PROCEDURE:  Procedure(s): ?DILATATION AND EVACUATION, PARACERVICAL BLOCK (N/A) ? ?SURGEON:  Surgeon(s) and Role: ?   Willodean Rosenthal, MD - Primary ? ?ANESTHESIA:   general and paracervical block ? ?EBL:  10 mL  ? ?BLOOD ADMINISTERED:none ? ?DRAINS: none  ? ?LOCAL MEDICATIONS USED:  MARCAINE    ? ?SPECIMEN:  Source of Specimen:  products or conception ? ?DISPOSITION OF SPECIMEN:  PATHOLOGY ? ?COUNTS:  YES ? ?TOURNIQUET:  * No tourniquets in log * ? ?DICTATION: .Note written in EPIC ? ?PLAN OF CARE: Discharge to home after PACU ? ?PATIENT DISPOSITION:  PACU - hemodynamically stable. ?  ?Delay start of Pharmacological VTE agent (>24hrs) due to surgical blood loss or risk of bleeding: not applicable ? ?Complications: none immediate ? ?Finnian Husted L. Harraway-Smith, M.D., FACOG ?  ? ?

## 2021-05-07 NOTE — Anesthesia Postprocedure Evaluation (Signed)
Anesthesia Post Note ? ?Patient: Victoria Miller ? ?Procedure(s) Performed: DILATATION AND EVACUATION, PARACERVICAL BLOCK ? ?  ? ?Patient location during evaluation: PACU ?Anesthesia Type: General ?Level of consciousness: awake ?Pain management: pain level controlled ?Respiratory status: spontaneous breathing ?Cardiovascular status: stable ?Postop Assessment: no apparent nausea or vomiting ?Anesthetic complications: no ? ? ?No notable events documented. ? ?Last Vitals:  ?Vitals:  ? 05/07/21 1712 05/07/21 1727  ?BP: 130/85 123/86  ?Pulse: 71 71  ?Resp: 16 16  ?Temp:    ?SpO2: 99% 99%  ?  ?Last Pain:  ?Vitals:  ? 05/07/21 1627  ?TempSrc:   ?PainSc: Asleep  ? ? ?  ?  ?  ?  ?  ?  ? ?[name] ? ? ? ? ?

## 2021-05-07 NOTE — Progress Notes (Signed)
EEG complete - results pending 

## 2021-05-07 NOTE — Consult Note (Signed)
Neurology Consultation ?Reason for Consult: Abnormal movements ?Referring Physician: Raynes-Smith, C ? ?CC: Abnormal Movements ? ?History is obtained from: Chart review, patient ? ?HPI: Victoria Miller is a 26 y.o. female who presented for D&C after missed abortion who following her procedure began having abnormal movements.  Procedure went normally, she received Marcaine as local anesthetic.  She began having abnormal movements about an hour after injection.  Neurology was consulted emergently, and on my evaluation, the patient had multifocal abnormal movements.  These had the characteristic of voluntary thrashing as opposed to fasciculations, Polly myoclonus, or clonic activity.  Given that she had received Marcaine, the low-dose, she was administered intravenous lipids.  She had side-to-side head shaking.  Initially, she did not respond to me, but she did blink to threat bilaterally.  With noxious stimulation, her symptoms aborted, and she was able to respond verbally and tell me her name, as well as tell me that she had never had anything like this before. ? ? ? ?ROS: Unable to obtain due to frequent movements. ? ?Past Medical History:  ?Diagnosis Date  ? History of blood transfusion   ? d/t miscarriage  ? History of ovarian cyst   ? Hypothyroidism   ? followed by pcp  ? Ruptured ovarian cyst 01/08/2018  ? Sickle cell trait (HCC)   ? ? ? ?Family History  ?Problem Relation Age of Onset  ? Heart disease Mother   ? Arthritis Mother   ? ? ? ?Social History:  reports that she has never smoked. She has never used smokeless tobacco. She reports that she does not currently use alcohol. She reports that she does not use drugs. ? ? ?Exam: ?Current vital signs: ?BP 123/86   Pulse 71   Temp 99 ?F (37.2 ?C)   Resp 16   Ht 5\' 11"  (1.803 m)   Wt 75.8 kg   LMP 03/03/2021   SpO2 99%   BMI 23.29 kg/m?  ?Vital signs in last 24 hours: ?Temp:  [97.7 ?F (36.5 ?C)-99 ?F (37.2 ?C)] 99 ?F (37.2 ?C) (05/02 1727) ?Pulse Rate:   [59-132] 71 (05/02 1727) ?Resp:  [15-30] 16 (05/02 1727) ?BP: (116-147)/(70-104) 123/86 (05/02 1727) ?SpO2:  [76 %-100 %] 99 % (05/02 1727) ?Weight:  [75.8 kg] 75.8 kg (05/02 1211) ? ? ?Physical Exam  ?Constitutional: Appears well-developed and well-nourished.  ? ? ? ?Neuro: ?Mental Status: ?Patient is awake, alert, she is able to answer questions despite bilateral frequent movements. ?Cranial Nerves: ?II: Visual Fields are full. Pupils are equal, round, and reactive to light.   ?III,IV, VI: With repeated prompting, she is able to look to both directions. ?VII: Facial movement is symmetric.  ?VIII: hearing is intact to voice ?Motor: ?She withdrew briskly to noxious stimulation, and is able to hold all extremities against gravity. ?Sensory: ?She withdrew to noxious stimulation briskly in all four extremities  ?Cerebellar: ?Does not perform ? ? ? ? ?I have reviewed labs in epic and the results pertinent to this consultation are: ?Glucose 76 ? ? ?Impression: 26 year old female with abnormal movements after D&C.  The movements were bilateral, asynchronous, with the character that was not very consistent with seizure.  She was conscious with bilateral movements, further arguing against ictal activity.  This did not appear to be myoclonic, and did not really appear choreoathetotic either.  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  This would be supported by the rapid return to baseline.  A stat EEG was obtained which was negative for ongoing  seizure activity. ? ?Recommendations: ?1) as long as the patient returns to normal, no further recommendations or work-up needed at this time. ? ? ?Ritta Slot, MD ?[facility] ?[phone] ? ?If 7pm- 7am, please page neurology on call as listed in AMION. ? ?

## 2021-05-07 NOTE — Anesthesia Preprocedure Evaluation (Addendum)
Anesthesia Evaluation  ?Patient identified by MRN, date of birth, ID band ?Patient awake ? ? ? ?Reviewed: ?Allergy & Precautions, NPO status , Patient's Chart, lab work & pertinent test results ? ?Airway ?Mallampati: II ? ?TM Distance: >3 FB ? ? ? ? Dental ?  ?Pulmonary ?neg pulmonary ROS,  ?  ?breath sounds clear to auscultation ? ? ? ? ? ? Cardiovascular ?negative cardio ROS ? ? ?Rhythm:Regular Rate:Normal ? ? ?  ?Neuro/Psych ?negative neurological ROS ?   ? GI/Hepatic ?negative GI ROS, Neg liver ROS,   ?Endo/Other  ?Hypothyroidism  ? Renal/GU ?negative Renal ROS  ? ?  ?Musculoskeletal ? ? Abdominal ?  ?Peds ? Hematology ?  ?Anesthesia Other Findings ? ? Reproductive/Obstetrics ? ?  ? ? ? ? ? ? ? ? ? ? ? ? ? ?  ?  ? ? ? ? ? ? ? ? ?Anesthesia Physical ?Anesthesia Plan ? ?ASA: 2 ? ?Anesthesia Plan: General  ? ?Post-op Pain Management:   ? ?Induction: Intravenous ? ?PONV Risk Score and Plan: 3 and Ondansetron, Dexamethasone and Midazolam ? ?Airway Management Planned: LMA ? ?Additional Equipment:  ? ?Intra-op Plan:  ? ?Post-operative Plan: Extubation in OR ? ?Informed Consent: I have reviewed the patients History and Physical, chart, labs and discussed the procedure including the risks, benefits and alternatives for the proposed anesthesia with the patient or authorized representative who has indicated his/her understanding and acceptance.  ? ? ? ?Dental advisory given ? ?Plan Discussed with: Anesthesiologist and CRNA ? ?Anesthesia Plan Comments:   ? ? ? ? ? ? ?Anesthesia Quick Evaluation ? ?

## 2021-05-07 NOTE — Progress Notes (Signed)
IV team to bedside in PACU for code stroke. Pt has sufficient access per stroke response team. ?

## 2021-05-07 NOTE — Op Note (Addendum)
05/07/2021 ? ?3:47 PM ? ?PATIENT:  Victoria Miller  26 y.o. female ? ?PRE-OPERATIVE DIAGNOSIS:  Missed Abortion ? ?POST-OPERATIVE DIAGNOSIS:  Missed Abortion ? ?PROCEDURE:  Procedure(s): ?DILATATION AND EVACUATION, PARACERVICAL BLOCK (N/A) ? ?SURGEON:  Surgeon(s) and Role: ?   Willodean Rosenthal, MD - Primary ? ?ANESTHESIA:   general and paracervical block ? ?EBL:  10 mL  ? ?BLOOD ADMINISTERED:none ? ?DRAINS: none  ? ?LOCAL MEDICATIONS USED:  MARCAINE    ? ?SPECIMEN:  Source of Specimen:  products or conception ? ?DISPOSITION OF SPECIMEN:  PATHOLOGY ? ?COUNTS:  YES ? ?TOURNIQUET:  * No tourniquets in log * ? ?DICTATION: .Note written in EPIC ? ?PLAN OF CARE: Discharge to home after PACU ? ?PATIENT DISPOSITION:  PACU - hemodynamically stable. ?  ?Delay start of Pharmacological VTE agent (>24hrs) due to surgical blood loss or risk of bleeding: not applicable ? ?Complications: none immediate ? ?INDICATIONS: 26 y.o. [id number] with MAB at 6 weeks by size on [place] and 8 weeks by dates gestation   Risks of surgery were discussed with the patient including but not limited to: bleeding which may require transfusion; infection which may require antibiotics; injury to uterus or surrounding organs; need for additional procedures including laparotomy or laparoscopy; possibility of intrauterine scarring which may impair future fertility; and other postoperative/anesthesia complications. Written informed consent was obtained.   ? ?FINDINGS:  A 6 week size uterus, moderate amounts of products of conception, specimen sent to pathology. ? ?PROCEDURE DETAILS:  The patient was taken to the operating room where monitored intravenous sedation was administered and was found to be adequate.  After an adequate timeout was performed, she was placed in the dorsal lithotomy position and examined; then prepped and draped in the sterile manner.   Her bladder was catheterized for an unmeasured amount of clear, yellow urine. A vaginal speculum was  then placed in the patient's vagina and a single tooth tenaculum was applied to the anterior lip of the cervix.  A paracervical block using 20 ml of 0.5% Marcaine was administered. The cervix was gently dilated to accommodate a 6 mm suction curette that was gently advanced to the uterine fundus.  The suction device was then activated to a suction of 50-71mmHg and curette slowly rotated to clear the uterus of only a small amount of products of conception.  A sharp curettage was then performed to confirm complete emptying of the uterus. There was minimal bleeding noted and the tenaculum removed with good hemostasis noted.   All instruments were removed from the patient's vagina. The patient tolerated the procedure well and was taken to the recovery area awake, and in stable condition. ? ?The patient will be discharged to home as per PACU criteria.  Routine postoperative instructions given.  She was prescribed Ibuprofen.  She will follow up in the clinic in 2 weeks for postoperative evaluation. ? ?Diamante Rubin L. Harraway-Smith, M.D., FACOG ? ? ?  ?

## 2021-05-07 NOTE — H&P (Signed)
Preoperative History and Physical ? ?Victoria Miller is a 26 y.o. [phone] here for surgical management of missed abortion at 8 weeks 96 weeks by measurement at [facility] 05/02/2021). Repeated by Dr. Adrian Blackwater on 05/02/2021.  ? ?Proposed surgery: dilation and evacuation for missed AB.  ? ?Past Medical History:  ?Diagnosis Date  ? History of blood transfusion   ? d/t miscarriage  ? History of ovarian cyst   ? Hypothyroidism   ? followed by pcp  ? Ruptured ovarian cyst 01/08/2018  ? Sickle cell trait (HCC)   ? ?Past Surgical History:  ?Procedure Laterality Date  ? DILATION AND CURETTAGE OF UTERUS    ? July 2022  ? ?OB History   ? ? Gravida  ?4  ? Para  ?1  ? Term  ?1  ? Preterm  ?   ? AB  ?1  ? Living  ?1  ?  ? ? SAB  ?   ? IAB  ?1  ? Ectopic  ?   ? Multiple  ?   ? Live Births  ?1  ?   ?  ?  ? ?Patient denies any cervical dysplasia or STIs. ?Medications Prior to Admission  ?Medication Sig Dispense Refill Last Dose  ? levothyroxine (SYNTHROID) 125 MCG tablet Take 1 tablet (125 mcg total) by mouth daily before breakfast. 2 tablets Sunday/Thursday, 1 tablet the remaining days (Patient taking differently: Take 125 mcg by mouth daily before breakfast.) 110 tablet 3   ? Prenat-FeAsp-Meth-FA-DHA w/o A (PRENATE PIXIE) 10-0.6-0.4-200 MG CAPS Take 1 capsule by mouth daily. 9 capsule 0   ? Magnesium Glycinate 100 MG CAPS Take 2 tablets by mouth daily. (Patient not taking: Reported on 05/02/2021) 60 capsule 6 Not Taking  ? vitamin B-12 (CYANOCOBALAMIN) 250 MCG tablet Take 1 tablet (250 mcg total) by mouth daily. (Patient not taking: Reported on 05/03/2021) 30 tablet 3 Not Taking  ?  ?No Known Allergies ?Social History:   reports that she has never smoked. She has never used smokeless tobacco. She reports that she does not currently use alcohol. She reports that she does not use drugs. ?Family History  ?Problem Relation Age of Onset  ? Heart disease Mother   ? Arthritis Mother   ? ? ?Review of Systems: Noncontributory ? ?PHYSICAL  EXAM: ?Blood pressure 123/70, pulse (!) 59, temperature 98.1 ?F (36.7 ?C), temperature source Oral, resp. rate 17, height 5\' 11"  (1.803 m), weight 75.8 kg, last menstrual period 03/03/2021, SpO2 99 %, unknown if currently breastfeeding. ?General appearance - alert, well appearing, and in no distress ?Chest - clear to auscultation, no wheezes, rales or rhonchi, symmetric air entry ?Heart - normal rate and regular rhythm ?Abdomen - soft, nontender, nondistended, no masses or organomegaly ?Pelvic - examination not indicated ?Extremities - peripheral pulses normal, no pedal edema, no clubbing or cyanosis ? ?Labs: ?No results found for this or any previous visit (from the past 336 hour(s)). ? ?Imaging Studies: ?No results found. ? ?Assessment: ?Patient Active Problem List  ? Diagnosis Date Noted  ? Missed abortion with fetal demise before 20 completed weeks of gestation 05/03/2021  ? Abnormal hemoglobin (HCC) 07/04/2020  ? Sickle cell trait (HCC) 01/08/2018  ? Hypothyroidism 01/08/2018  ? Bacterial vaginosis 01/08/2018  ? ? ?Plan: ?Patient will undergo surgical management with dilation and evacuation for missed AB at 6 weeks size (8 weeks by dates).   The risks of surgery were discussed in detail with the patient including but not limited to: bleeding which may  require transfusion or reoperation; infection which may require antibiotics; injury to surrounding organs which may involve bowel, bladder, ureters ; need for additional procedures including laparoscopy or laparotomy; thromboembolic phenomenon, surgical site problems and other postoperative/anesthesia complications. Likelihood of success in alleviating the patient's condition was discussed. Routine postoperative instructions will be reviewed with the patient and her family in detail after surgery.  The patient concurred with the proposed plan, giving informed written consent for the surgery.  Patient has been NPO since last night she will remain NPO for procedure.   Anesthesia and OR aware.  Preoperative prophylactic antibiotics and SCDs ordered on call to the OR.  To OR when ready. ? ?Camren Henthorn L. Erin Fulling, M.D., FACOG ?05/07/2021 12:15 PM ?  ? ?
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
## 2021-05-07 NOTE — Anesthesia Procedure Notes (Signed)
Procedure Name: LMA Insertion ?Date/Time: 05/07/2021 3:08 PM ?Performed by: Maude Leriche, CRNA ?Pre-anesthesia Checklist: Patient identified, Emergency Drugs available, Suction available and Patient being monitored ?Patient Re-evaluated:Patient Re-evaluated prior to induction ?Oxygen Delivery Method: Circle system utilized ?Preoxygenation: Pre-oxygenation with 100% oxygen ?Induction Type: IV induction ?Ventilation: Mask ventilation without difficulty ?LMA: LMA inserted ?LMA Size: 4.0 ?Tube type: Oral ?Number of attempts: 1 ?Placement Confirmation: positive ETCO2 and breath sounds checked- equal and bilateral ?Tube secured with: Tape ?Dental Injury: Teeth and Oropharynx as per pre-operative assessment  ? ? ? ? ?

## 2021-05-07 NOTE — Transfer of Care (Signed)
Immediate Anesthesia Transfer of Care Note ? ?Patient: Victoria Miller ? ?Procedure(s) Performed: DILATATION AND EVACUATION, PARACERVICAL BLOCK ? ?Patient Location: PACU ? ?Anesthesia Type:General ? ?Level of Consciousness: awake, alert  and oriented ? ?Airway & Oxygen Therapy: Patient Spontanous Breathing and Patient connected to nasal cannula oxygen ? ?Post-op Assessment: Report given to RN, Post -op Vital signs reviewed and stable, Patient moving all extremities X 4 and Patient able to stick tongue midline ? ?Post vital signs: Reviewed ? ?Last Vitals:  ?Vitals Value Taken Time  ?BP 127/46   ?Temp 97.7   ?Pulse 65 05/07/21 1536  ?Resp 17 05/07/21 1536  ?SpO2 100 % 05/07/21 1536  ?Vitals shown include unvalidated device data. ? ?Last Pain:  ?Vitals:  ? 05/07/21 1239  ?TempSrc:   ?PainSc: 0-No pain  ?   ? ?  ? ?Complications: No notable events documented. ?

## 2021-05-07 NOTE — Op Note (Signed)
Pt had abnormal movements post op. During this time, she was able to intermittently respond to questions. Her vital signs were stable. She had no loss of bowel or bladder function. She received Versed with no change in her sx. She also received Narcan and Interlipids with no change in her behavior. Neuro was consulted. He did not feel that the movements were consistent with a seizure. An EEG was performed and no seizure activity was noted. The movements spontaneously resolved and discharge order were resumed.  ? ?Kanai Hilger L. Harraway-Smith, M.D., FACOG ?    ?

## 2021-05-08 ENCOUNTER — Encounter (HOSPITAL_COMMUNITY): Payer: Self-pay | Admitting: Obstetrics & Gynecology

## 2021-05-08 LAB — SURGICAL PATHOLOGY

## 2021-05-16 ENCOUNTER — Encounter: Payer: Medicaid Other | Admitting: Family Medicine

## 2021-05-17 LAB — BENZODIAZEPINES,MS,WB/SP RFX
7-Aminoclonazepam: NEGATIVE ng/mL
Alprazolam: NEGATIVE ng/mL
Benzodiazepines Confirm: POSITIVE
Chlordiazepoxide: NEGATIVE
Clonazepam: NEGATIVE ng/mL
Desalkylflurazepam: NEGATIVE ng/mL
Desmethylchlordiazepoxide: NEGATIVE
Desmethyldiazepam: NEGATIVE ng/mL
Diazepam: NEGATIVE ng/mL
Flurazepam: NEGATIVE ng/mL
Lorazepam: NEGATIVE ng/mL
Midazolam: 40.1 ng/mL
Oxazepam: NEGATIVE ng/mL
Temazepam: NEGATIVE ng/mL
Triazolam: NEGATIVE ng/mL

## 2021-05-17 LAB — DRUG SCREEN 10 W/CONF, SERUM
Amphetamines, IA: NEGATIVE ng/mL
Barbiturates, IA: NEGATIVE ug/mL
Benzodiazepines, IA: POSITIVE ng/mL — AB
Cocaine & Metabolite, IA: NEGATIVE ng/mL
Methadone, IA: NEGATIVE ng/mL
Opiates, IA: NEGATIVE ng/mL
Oxycodones, IA: NEGATIVE ng/mL
Phencyclidine, IA: NEGATIVE ng/mL
Propoxyphene, IA: NEGATIVE ng/mL
THC(Marijuana) Metabolite, IA: NEGATIVE ng/mL

## 2021-05-23 ENCOUNTER — Ambulatory Visit: Payer: Medicaid Other | Admitting: Family Medicine

## 2021-07-04 ENCOUNTER — Encounter: Payer: Self-pay | Admitting: Family Medicine

## 2021-07-05 DIAGNOSIS — Z3202 Encounter for pregnancy test, result negative: Secondary | ICD-10-CM | POA: Diagnosis not present

## 2021-07-05 DIAGNOSIS — N939 Abnormal uterine and vaginal bleeding, unspecified: Secondary | ICD-10-CM | POA: Diagnosis not present

## 2021-07-31 ENCOUNTER — Encounter: Payer: Self-pay | Admitting: Internal Medicine

## 2021-07-31 ENCOUNTER — Encounter: Payer: Self-pay | Admitting: Family Medicine

## 2021-07-31 ENCOUNTER — Other Ambulatory Visit: Payer: Self-pay | Admitting: Internal Medicine

## 2021-07-31 DIAGNOSIS — E039 Hypothyroidism, unspecified: Secondary | ICD-10-CM

## 2021-07-31 MED ORDER — LEVOTHYROXINE SODIUM 125 MCG PO TABS: 125.0000 ug | ORAL_TABLET | Freq: Every day | ORAL | 0 refills | Status: DC

## 2021-08-16 ENCOUNTER — Inpatient Hospital Stay (HOSPITAL_COMMUNITY)
Admission: EM | Admit: 2021-08-16 | Discharge: 2021-08-16 | Disposition: A | Discharge status: Home / Self Care | Payer: Medicaid Other | Attending: Obstetrics & Gynecology | Admitting: Obstetrics & Gynecology

## 2021-08-16 ENCOUNTER — Inpatient Hospital Stay (HOSPITAL_COMMUNITY): Payer: Medicaid Other

## 2021-08-16 ENCOUNTER — Encounter (HOSPITAL_COMMUNITY): Payer: Self-pay | Admitting: Emergency Medicine

## 2021-08-16 ENCOUNTER — Other Ambulatory Visit: Payer: Self-pay

## 2021-08-16 DIAGNOSIS — O3481 Maternal care for other abnormalities of pelvic organs, first trimester: Secondary | ICD-10-CM | POA: Insufficient documentation

## 2021-08-16 DIAGNOSIS — Z3A01 Less than 8 weeks gestation of pregnancy: Secondary | ICD-10-CM | POA: Diagnosis not present

## 2021-08-16 DIAGNOSIS — R109 Unspecified abdominal pain: Secondary | ICD-10-CM

## 2021-08-16 DIAGNOSIS — O26891 Other specified pregnancy related conditions, first trimester: Secondary | ICD-10-CM | POA: Diagnosis not present

## 2021-08-16 DIAGNOSIS — N83201 Unspecified ovarian cyst, right side: Secondary | ICD-10-CM | POA: Insufficient documentation

## 2021-08-16 DIAGNOSIS — N838 Other noninflammatory disorders of ovary, fallopian tube and broad ligament: Secondary | ICD-10-CM | POA: Diagnosis not present

## 2021-08-16 LAB — COMPREHENSIVE METABOLIC PANEL
ALT: 13 U/L (ref 0–44)
AST: 19 U/L (ref 15–41)
Albumin: 4 g/dL (ref 3.5–5.0)
Alkaline Phosphatase: 31 U/L — ABNORMAL LOW (ref 38–126)
Anion gap: 6 (ref 5–15)
BUN: 12 mg/dL (ref 6–20)
CO2: 24 mmol/L (ref 22–32)
Calcium: 9.3 mg/dL (ref 8.9–10.3)
Chloride: 103 mmol/L (ref 98–111)
Creatinine, Ser: 0.72 mg/dL (ref 0.44–1.00)
GFR, Estimated: >60 mL/min (ref >60)
Glucose, Bld: 99 mg/dL (ref 70–99)
Potassium: 3.8 mmol/L (ref 3.5–5.1)
Sodium: 133 mmol/L — ABNORMAL LOW (ref 135–145)
Total Bilirubin: 0.4 mg/dL (ref 0.3–1.2)
Total Protein: 7.3 g/dL (ref 6.5–8.1)

## 2021-08-16 LAB — CBC
HCT: 34.3 % — ABNORMAL LOW (ref 36.0–46.0)
Hemoglobin: 12.4 g/dL (ref 12.0–15.0)
MCH: 31.5 pg (ref 26.0–34.0)
MCHC: 36.2 g/dL — ABNORMAL HIGH (ref 30.0–36.0)
MCV: 87.1 fL (ref 80.0–100.0)
Platelets: 235 10*3/uL (ref 150–400)
RBC: 3.94 MIL/uL (ref 3.87–5.11)
RDW: 11.8 % (ref 11.5–15.5)
WBC: 8.5 10*3/uL (ref 4.0–10.5)
nRBC: 0 % (ref 0.0–0.2)

## 2021-08-16 LAB — URINALYSIS, ROUTINE W REFLEX MICROSCOPIC
Bilirubin Urine: NEGATIVE
Glucose, UA: NEGATIVE mg/dL
Hgb urine dipstick: NEGATIVE
Ketones, ur: NEGATIVE mg/dL
Leukocytes,Ua: NEGATIVE
Nitrite: NEGATIVE
Protein, ur: NEGATIVE mg/dL
Specific Gravity, Urine: 1.029 (ref 1.005–1.030)
pH: 6 (ref 5.0–8.0)

## 2021-08-16 LAB — WET PREP, GENITAL
Clue Cells Wet Prep HPF POC: NONE SEEN
Sperm: NONE SEEN
Trich, Wet Prep: NONE SEEN
WBC, Wet Prep HPF POC: <10 (ref <10)
Yeast Wet Prep HPF POC: NONE SEEN

## 2021-08-16 LAB — HCG, QUANTITATIVE, PREGNANCY: hCG, Beta Chain, Quant, S: 40948 m[IU]/mL — ABNORMAL HIGH (ref <5)

## 2021-08-16 LAB — POCT PREGNANCY, URINE: Preg Test, Ur: POSITIVE — AB

## 2021-08-16 MED ORDER — ACETAMINOPHEN 325 MG PO TABS
650.0000 mg | ORAL_TABLET | Freq: Once | ORAL | Status: AC
  Administered 2021-08-16: 650 mg via ORAL
  Filled 2021-08-16: qty 2

## 2021-08-16 MED ORDER — CYCLOBENZAPRINE HCL 5 MG PO TABS
10.0000 mg | ORAL_TABLET | Freq: Once | ORAL | Status: AC
  Administered 2021-08-16: 10 mg via ORAL
  Filled 2021-08-16: qty 2

## 2021-08-16 NOTE — ED Notes (Signed)
Report given to MAU RN on patient's transfer.  
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
## 2021-08-19 LAB — GC/CHLAMYDIA PROBE AMP (~~LOC~~) NOT AT ARMC
Chlamydia: NEGATIVE
Comment: NEGATIVE
Comment: NORMAL
Neisseria Gonorrhea: NEGATIVE

## 2021-08-20 ENCOUNTER — Encounter: Payer: Self-pay | Admitting: Family Medicine

## 2021-08-20 DIAGNOSIS — E039 Hypothyroidism, unspecified: Secondary | ICD-10-CM

## 2021-08-20 MED ORDER — LEVOTHYROXINE SODIUM 125 MCG PO TABS: 125.0000 ug | ORAL_TABLET | Freq: Every day | ORAL | 3 refills | Status: DC

## 2021-08-27 ENCOUNTER — Encounter: Payer: Self-pay | Admitting: Family Medicine

## 2021-09-03 ENCOUNTER — Other Ambulatory Visit: Payer: Self-pay

## 2021-09-03 ENCOUNTER — Encounter (HOSPITAL_COMMUNITY): Payer: Self-pay | Admitting: Obstetrics & Gynecology

## 2021-09-03 ENCOUNTER — Encounter: Payer: Self-pay | Admitting: Family Medicine

## 2021-09-03 ENCOUNTER — Inpatient Hospital Stay (HOSPITAL_COMMUNITY)
Admission: AD | Admit: 2021-09-03 | Discharge: 2021-09-03 | Disposition: A | Discharge status: Home / Self Care | Payer: Medicaid Other | Attending: Obstetrics & Gynecology | Admitting: Obstetrics & Gynecology

## 2021-09-03 DIAGNOSIS — O26891 Other specified pregnancy related conditions, first trimester: Secondary | ICD-10-CM | POA: Diagnosis not present

## 2021-09-03 DIAGNOSIS — O219 Vomiting of pregnancy, unspecified: Secondary | ICD-10-CM

## 2021-09-03 DIAGNOSIS — R519 Headache, unspecified: Secondary | ICD-10-CM | POA: Diagnosis not present

## 2021-09-03 DIAGNOSIS — Z3A08 8 weeks gestation of pregnancy: Secondary | ICD-10-CM | POA: Insufficient documentation

## 2021-09-03 LAB — URINALYSIS, ROUTINE W REFLEX MICROSCOPIC
Bilirubin Urine: NEGATIVE
Glucose, UA: NEGATIVE mg/dL
Ketones, ur: NEGATIVE mg/dL
Leukocytes,Ua: NEGATIVE
Nitrite: NEGATIVE
Protein, ur: NEGATIVE mg/dL
Specific Gravity, Urine: 1.023 (ref 1.005–1.030)
pH: 6 (ref 5.0–8.0)

## 2021-09-03 MED ORDER — PROMETHAZINE HCL 25 MG PO TABS: 25.0000 mg | ORAL_TABLET | Freq: Four times a day (QID) | ORAL | 0 refills | Status: DC | PRN

## 2021-09-03 MED ORDER — LACTATED RINGERS IV BOLUS
1000.0000 mL | Freq: Once | INTRAVENOUS | Status: AC
  Administered 2021-09-03: 1000 mL via INTRAVENOUS

## 2021-09-03 MED ORDER — FAMOTIDINE IN NACL 20-0.9 MG/50ML-% IV SOLN
20.0000 mg | Freq: Once | INTRAVENOUS | Status: AC
Start: 2021-09-03 — End: 2021-09-03
  Administered 2021-09-03: 20 mg via INTRAVENOUS
  Filled 2021-09-03: qty 50

## 2021-09-03 MED ORDER — SCOPOLAMINE 1 MG/3DAYS TD PT72SCOPOLAMINE 1 MG/3DAYS
1.0000 | MEDICATED_PATCH | Freq: Once | TRANSDERMAL | Status: DC
Start: 2021-09-03 — End: 2021-09-03
  Administered 2021-09-03: 1.5 mg via TRANSDERMAL
  Filled 2021-09-03: qty 1

## 2021-09-03 MED ORDER — METOCLOPRAMIDE HCL 5 MG/ML IJ SOLN
10.0000 mg | Freq: Once | INTRAMUSCULAR | Status: AC
Start: 2021-09-03 — End: 2021-09-03
  Administered 2021-09-03: 10 mg via INTRAVENOUS
  Filled 2021-09-03: qty 2

## 2021-09-03 MED ORDER — ACETAMINOPHEN-CAFFEINE 500-65 MG PO TABS
1.0000 | ORAL_TABLET | Freq: Once | ORAL | Status: AC
  Administered 2021-09-03: 1 via ORAL
  Filled 2021-09-03: qty 1

## 2021-09-03 NOTE — MAU Note (Signed)
.  Victoria Miller is a 26 y.o. at [redacted]w[redacted]d here in MAU reporting: has had a migraine for the last x3 days. States she has taken magnesium for it without relief. Also reporting nausea and vomiting since 6 weeks of pregnancy. States she called her doctor today and they sent her in phenergan, has not picked it up yet. Denies VB, but has had some brown discharge since august 22 that has been intermittent, no odor.   Pain score: 9 Vitals:   09/03/21 1534  BP: 111/77  Pulse: 82  Resp: 14  Temp: 98.8 F (37.1 C)  SpO2: 100%      Lab orders placed from triage:  UA

## 2021-09-03 NOTE — MAU Provider Note (Signed)
History     CSN: [id number]  Arrival date and time: 09/03/21 1516   Event Date/Time   First Provider Initiated Contact with Patient 09/03/21 1612      Chief Complaint  Patient presents with   Headache   Nausea   Emesis   Victoria Miller, a  26 y.o. [id number] at [redacted]w[redacted]d presents to MAU with complaints of a Migraine for the last 3 days. She attempted PO mag tablets without relief. Currently rating pain a 9/10. Patient points to the Sides by the temple and states that it radiates to the back on the left side only. Headache worsened by light, but not by sound. Denies visual changes.    She also endorses Nausea and vomiting for the last 2 weeks. Patient states that with the headache the nausea and vomiting Has gotten significantly worse and she's vomiting "3-4 times a day." States she is Unable to keep water down. She sent a MyChart message and was prescribed Phenergan but hasn't been able to pick up. Denies vaginal bleeding and leaking of fluid. No abdominal pain, and no other complaints.      OB History     Gravida  5   Para  1   Term  1   Preterm      AB  3   Living  1      SAB  2   IAB  1   Ectopic      Multiple      Live Births  1           Past Medical History:  Diagnosis Date   History of blood transfusion    d/t miscarriage   History of ovarian cyst    Hypothyroidism    followed by pcp   Ruptured ovarian cyst 01/08/2018   Sickle cell trait [facility])     Past Surgical History:  Procedure Laterality Date   DILATION AND CURETTAGE OF UTERUS     July 2022   DILATION AND EVACUATION N/A 05/07/2021   Procedure: DILATATION AND EVACUATION, PARACERVICAL BLOCK;  Surgeon: Willodean Rosenthal, MD;  Location: [facility] OR;  Service: Gynecology;  Laterality: N/A;    Family History  Problem Relation Age of Onset   Heart disease Mother    Arthritis Mother     Social History   Tobacco Use   Smoking status: Never   Smokeless tobacco: Never  Vaping Use   Vaping Use:  Never used  Substance Use Topics   Alcohol use: Not Currently   Drug use: Never    Allergies: No Known Allergies  Medications Prior to Admission  Medication Sig Dispense Refill Last Dose   levothyroxine (SYNTHROID) 125 MCG tablet Take 1 tablet (125 mcg total) by mouth daily before breakfast. 9 tablets a week - 2 tabs on Sunday and Thursday, 1 tab daily all other days. 115 tablet 3 09/03/2021   magnesium oxide (MAG-OX) 400 (240 Mg) MG tablet Take 250 mg by mouth daily.   09/02/2021   Prenat-FeAsp-Meth-FA-DHA w/o A (PRENATE PIXIE) 10-0.6-0.4-200 MG CAPS Take 1 capsule by mouth daily. 9 capsule 0 09/03/2021   promethazine (PHENERGAN) 25 MG tablet Take 1 tablet (25 mg total) by mouth every 6 (six) hours as needed for nausea or vomiting. 30 tablet 0     Review of Systems  Constitutional:  Negative for chills, fatigue and fever.  HENT:  Negative for congestion, sinus pressure, sinus pain, sneezing and sore throat.   Eyes:  Positive for photophobia. Negative for visual  disturbance.  Respiratory:  Negative for apnea, shortness of breath and wheezing.   Cardiovascular:  Negative for chest pain and palpitations.  Gastrointestinal:  Positive for nausea and vomiting. Negative for abdominal pain.  Neurological:  Positive for headaches. Negative for dizziness, weakness, light-headedness and numbness.  Psychiatric/Behavioral:  Negative for suicidal ideas.    Physical Exam   Blood pressure 111/77, pulse 82, temperature 98.8 F (37.1 C), temperature source Oral, resp. rate 14, height 5\' 11"  (1.803 m), weight 78.9 kg, last menstrual period 07/05/2021, SpO2 100 %, unknown if currently breastfeeding.  Physical Exam Vitals and nursing note reviewed.  Constitutional:      General: She is not in acute distress.    Appearance: Normal appearance.  HENT:     Head: Normocephalic.  Cardiovascular:     Rate and Rhythm: Normal rate and regular rhythm.  Pulmonary:     Effort: Pulmonary effort is normal.      Breath sounds: Normal breath sounds.  Abdominal:     Palpations: Abdomen is soft.  Musculoskeletal:        General: Normal range of motion.     Cervical back: Normal range of motion.  Skin:    General: Skin is warm and dry.  Neurological:     Mental Status: She is alert and oriented to person, place, and time.     GCS: GCS eye subscore is 4. GCS verbal subscore is 5. GCS motor subscore is 6.     Motor: No weakness.     Gait: Gait normal.  Psychiatric:        Mood and Affect: Mood normal.        Speech: Speech normal.        Behavior: Behavior normal.     MAU Course  Procedures Orders Placed This Encounter  Procedures   Urinalysis, Routine w reflex microscopic Urine, Clean Catch   Insert peripheral IV   Meds ordered this encounter  Medications   lactated ringers bolus 1,000 mL   metoCLOPramide (REGLAN) injection 10 mg   acetaminophen-caffeine (EXCEDRIN TENSION HEADACHE) 500-65 MG per tablet 1 tablet   scopolamine (TRANSDERM-SCOP) 1 MG/3DAYS 1.5 mg   famotidine (PEPCID) IVPB 20 mg premix    MDM Headache improved with IV fluids and  PO medication.  Nausea and vomiting improved with Scop Patch and Pepcid.  PO challenge successful.  Plan for discharge   Assessment and Plan   1. Pregnancy headache in first trimester   2. Nausea and vomiting in pregnancy   3. [redacted] weeks gestation of pregnancy    - Discussed normalcy of nausea and vomiting in earlt pregnancy.  - Rx for Phenergan had already been sent to Pharmacy. Encourage patient to take it as prescribed.  - Upcoming appointment with Dr. 07/07/2021. Encouraged to attend scheduled appointment.  - Worsening signs and return precautions reviewed.  - Early pregnancy precautions reviewed.  - Patient discharged home in stable condition and may return to MAU as needed.   Adrian Blackwater, MSN CNM  09/03/2021, 4:12 PM

## 2021-09-03 NOTE — Progress Notes (Signed)
Patient sent Mychart message requesting medication for nausea and vomiting. Phenergan 25 mg 1 tab PO q6h was sent to her pharmacy.  Kyrstin Campillo l Sevastian Witczak, CMA

## 2021-09-04 ENCOUNTER — Other Ambulatory Visit (HOSPITAL_COMMUNITY)
Admission: RE | Admit: 2021-09-04 | Discharge: 2021-09-04 | Disposition: A | Discharge status: Home / Self Care | Payer: Medicaid Other | Source: Ambulatory Visit | Attending: Family Medicine | Admitting: Family Medicine

## 2021-09-04 ENCOUNTER — Ambulatory Visit (INDEPENDENT_AMBULATORY_CARE_PROVIDER_SITE_OTHER): Payer: Medicaid Other | Admitting: Family Medicine

## 2021-09-04 ENCOUNTER — Encounter: Payer: Self-pay | Admitting: General Practice

## 2021-09-04 VITALS — BP 113/62 | HR 71 | Wt 174.0 lb

## 2021-09-04 DIAGNOSIS — E039 Hypothyroidism, unspecified: Secondary | ICD-10-CM

## 2021-09-04 DIAGNOSIS — O09899 Supervision of other high risk pregnancies, unspecified trimester: Secondary | ICD-10-CM | POA: Insufficient documentation

## 2021-09-04 DIAGNOSIS — Z3A08 8 weeks gestation of pregnancy: Secondary | ICD-10-CM | POA: Diagnosis not present

## 2021-09-04 DIAGNOSIS — O09891 Supervision of other high risk pregnancies, first trimester: Secondary | ICD-10-CM | POA: Diagnosis not present

## 2021-09-04 NOTE — Progress Notes (Signed)
Subjective:  Victoria Miller is a [id number] [redacted]w[redacted]d LMP, c/w [place] today, being seen today for her first obstetrical visit.  Her obstetrical history is significant for  hypothyroidism . Has NSVD without any labor complications. Patient does intend to breast feed. Pregnancy history fully reviewed.  Patient reports nausea.  BP 113/62   Pulse 71   Wt 174 lb (78.9 kg)   LMP 07/05/2021 (Approximate)   BMI 24.27 kg/m   HISTORY: OB History  Gravida Para Term Preterm AB Living  5 1 1   3 1   SAB IAB Ectopic Multiple Live Births  2 1     1     # Outcome Date GA Lbr Len/2nd Weight Sex Delivery Anes PTL Lv  5 Current           4 SAB 2023          3 IAB 01/17/20 [redacted]w[redacted]d         2 SAB 2022          1 Term 06/14/17 [redacted]w[redacted]d  7 lb 12 oz (3.515 kg) F Vag-Spont  N LIV    Past Medical History:  Diagnosis Date   History of blood transfusion    d/t miscarriage   History of ovarian cyst    Hypothyroidism    followed by pcp   Ruptured ovarian cyst 01/08/2018   Sickle cell trait [facility])     Past Surgical History:  Procedure Laterality Date   DILATION AND CURETTAGE OF UTERUS     July 2022   DILATION AND EVACUATION N/A 05/07/2021   Procedure: DILATATION AND EVACUATION, PARACERVICAL BLOCK;  Surgeon: August 2022, MD;  Location: [facility];  Service: Gynecology;  Laterality: N/A;    Family History  Problem Relation Age of Onset   Heart disease Mother    Arthritis Mother      Exam  BP 113/62   Pulse 71   Wt 174 lb (78.9 kg)   LMP 07/05/2021 (Approximate)   BMI 24.27 kg/m   Chaperone present during exam  CONSTITUTIONAL: Well-developed, well-nourished female in no acute distress.  HENT:  Normocephalic, atraumatic, External right and left ear normal. Oropharynx is clear and moist EYES: Conjunctivae and EOM are normal. Pupils are equal, round, and reactive to light. No scleral icterus.  NECK: Normal range of motion, supple, no masses.  Normal thyroid.  CARDIOVASCULAR: Normal heart rate noted,  regular rhythm RESPIRATORY: Clear to auscultation bilaterally. Effort and breath sounds normal, no problems with respiration noted. BREASTS: Symmetric in size. No masses, skin changes, nipple drainage, or lymphadenopathy. ABDOMEN: Soft, normal bowel sounds, no distention noted.  No tenderness, rebound or guarding.  PELVIC: Normal appearing external genitalia; normal appearing vaginal mucosa and cervix. No abnormal discharge noted. Normal uterine size, no other palpable masses, no uterine or adnexal tenderness. MUSCULOSKELETAL: Normal range of motion. No tenderness.  No cyanosis, clubbing, or edema.  2+ distal pulses. SKIN: Skin is warm and dry. No rash noted. Not diaphoretic. No erythema. No pallor. NEUROLOGIC: Alert and oriented to person, place, and time. Normal reflexes, muscle tone coordination. No cranial nerve deficit noted. PSYCHIATRIC: Normal mood and affect. Normal behavior. Normal judgment and thought content.    Assessment:    Pregnancy: Victoria Miller Patient Active Problem List   Diagnosis Date Noted   Supervision of other high risk pregnancy, antepartum 09/04/2021   Missed abortion with fetal demise before 20 completed weeks of gestation 05/03/2021   Abnormal hemoglobin (HCC) 07/04/2020   Sickle cell trait (HCC) 01/08/2018  Hypothyroidism 01/08/2018   Bacterial vaginosis 01/08/2018      Plan:   1. Supervision of other high risk pregnancy, antepartum FHT normal. [place] matches LMP - [facility] 04/11/2022 - CBC/D/Plt+RPR+Rh+ABO+RubIgG... - [place] MFM OB DETAIL +14 WK; Future - Culture, OB Urine - TSH - Cytology - PAP( [place])  2. Acquired hypothyroidism Check TSH. Taking 9 doses of synthroid weekly. - TSH    Initial labs obtained Continue prenatal vitamins Reviewed n/v relief measures and warning s/s to report Reviewed recommended weight gain based on pre-gravid BMI Encouraged well-balanced diet Genetic & carrier screening discussed: undecided about [facility],  Ultrasound  discussed; fetal survey: requested CCNC completed> form faxed if has or is planning to apply for medicaid The nature of [facility] [employer] with multiple MDs and other Advanced Practice Providers was explained to patient; also emphasized that fellows, residents, and students are part of our team.   Problem list reviewed and updated. 75% of 30 min visit spent on counseling and coordination of care.     Levie Heritage 09/04/2021

## 2021-09-05 LAB — CBC/D/PLT+RPR+RH+ABO+RUBIGG...
Antibody Screen: NEGATIVE
Basophils Absolute: 0 10*3/uL (ref 0.0–0.2)
Basos: 1 %
EOS (ABSOLUTE): 0.1 10*3/uL (ref 0.0–0.4)
Eos: 2 %
HCV Ab: NONREACTIVE
HIV Screen 4th Generation wRfx: NONREACTIVE
Hematocrit: 36.3 % (ref 34.0–46.6)
Hemoglobin: 12.4 g/dL (ref 11.1–15.9)
Hepatitis B Surface Ag: NEGATIVE
Immature Grans (Abs): 0 10*3/uL (ref 0.0–0.1)
Immature Granulocytes: 0 %
Lymphocytes Absolute: 1.7 10*3/uL (ref 0.7–3.1)
Lymphs: 22 %
MCH: 30.8 pg (ref 26.6–33.0)
MCHC: 34.2 g/dL (ref 31.5–35.7)
MCV: 90 fL (ref 79–97)
Monocytes Absolute: 0.6 10*3/uL (ref 0.1–0.9)
Monocytes: 7 %
Neutrophils Absolute: 5.3 10*3/uL (ref 1.4–7.0)
Neutrophils: 68 %
Platelets: 250 10*3/uL (ref 150–450)
RBC: 4.03 x10E6/uL (ref 3.77–5.28)
RDW: 12.4 % (ref 11.7–15.4)
RPR Ser Ql: NONREACTIVE
Rh Factor: POSITIVE
Rubella Antibodies, IGG: 1.68 index (ref >0.99)
WBC: 7.8 10*3/uL (ref 3.4–10.8)

## 2021-09-05 LAB — HCV INTERPRETATION

## 2021-09-05 LAB — TSH: TSH: 6.26 u[IU]/mL — ABNORMAL HIGH (ref 0.450–4.500)

## 2021-09-06 LAB — URINE CULTURE, OB REFLEX

## 2021-09-06 LAB — CULTURE, OB URINE

## 2021-09-06 MED ORDER — LEVOTHYROXINE SODIUM 200 MCG PO TABS
200.0000 ug | ORAL_TABLET | Freq: Every day | ORAL | 1 refills | Status: DC
Start: 2021-09-06 — End: 2022-04-09

## 2021-09-06 NOTE — Addendum Note (Signed)
Addended by: Levie Heritage on: 09/06/2021 08:19 AM   Modules accepted: Orders

## 2021-09-11 LAB — CYTOLOGY - PAP
Chlamydia: NEGATIVE
Comment: NEGATIVE
Comment: NORMAL
Neisseria Gonorrhea: NEGATIVE

## 2021-10-09 ENCOUNTER — Ambulatory Visit (INDEPENDENT_AMBULATORY_CARE_PROVIDER_SITE_OTHER): Payer: Medicaid Other | Admitting: Family Medicine

## 2021-10-09 VITALS — BP 124/72 | HR 88 | Wt 174.0 lb

## 2021-10-09 DIAGNOSIS — D573 Sickle-cell trait: Secondary | ICD-10-CM

## 2021-10-09 DIAGNOSIS — O09899 Supervision of other high risk pregnancies, unspecified trimester: Secondary | ICD-10-CM

## 2021-10-09 DIAGNOSIS — E039 Hypothyroidism, unspecified: Secondary | ICD-10-CM | POA: Diagnosis not present

## 2021-10-09 DIAGNOSIS — Z3A13 13 weeks gestation of pregnancy: Secondary | ICD-10-CM

## 2021-10-09 NOTE — Progress Notes (Signed)
   PRENATAL VISIT NOTE  Subjective:  Victoria Miller is a 26 y.o. [id number] at [redacted]w[redacted]d being seen today for ongoing prenatal care.  She is currently monitored for the following issues for this high-risk pregnancy and has Sickle cell trait ([place]); Hypothyroidism; Bacterial vaginosis; Abnormal hemoglobin ([place]); Missed abortion with fetal demise before 7 completed weeks of gestation; and Supervision of other high risk pregnancy, antepartum on their problem list.  Patient reports  tired .  Contractions: Not present. Vag. Bleeding: None.   . Denies leaking of fluid.   The following portions of the patient's history were reviewed and updated as appropriate: allergies, current medications, past family history, past medical history, past social history, past surgical history and problem list.   Objective:   Vitals:   10/09/21 1040  BP: 124/72  Pulse: 88  Weight: 174 lb (78.9 kg)    Fetal Status:           General:  Alert, oriented and cooperative. Patient is in no acute distress.  Skin: Skin is warm and dry. No rash noted.   Cardiovascular: Normal heart rate noted  Respiratory: Normal respiratory effort, no problems with respiration noted  Abdomen: Soft, gravid, appropriate for gestational age.  Pain/Pressure: Absent     Pelvic: Cervical exam deferred        Extremities: Normal range of motion.  Edema: None  Mental Status: Normal mood and affect. Normal behavior. Normal judgment and thought content.   Assessment and Plan:  Pregnancy: [id number] at [redacted]w[redacted]d 1. [redacted] weeks gestation of pregnancy  2. Supervision of other high risk pregnancy, antepartum FHT normal  3. Hypothyroidism, unspecified type Check TSH today. On levothyroxine  - TSH  4. Sickle cell trait ([place])   Preterm labor symptoms and general obstetric precautions including but not limited to vaginal bleeding, contractions, leaking of fluid and fetal movement were reviewed in detail with the patient. Please refer to After Visit Summary  for other counseling recommendations.   No follow-ups on file.  Future Appointments  Date Time Provider [place]  11/06/2021 10:55 AM Truett Mainland, DO [facility] None  11/20/2021 10:15 AM [facility] NURSE [facility]  11/20/2021 10:30 AM [facility] US3 [facility]  11/27/2021 10:35 AM Darliss Cheney, MD [facility] None    Truett Mainland, DO

## 2021-10-10 LAB — TSH: TSH: 0.502 u[IU]/mL (ref 0.450–4.500)

## 2021-10-15 ENCOUNTER — Encounter: Payer: Self-pay | Admitting: Family Medicine

## 2021-10-17 ENCOUNTER — Inpatient Hospital Stay (HOSPITAL_COMMUNITY)
Admission: AD | Admit: 2021-10-17 | Discharge: 2021-10-17 | Disposition: A | Discharge status: Home / Self Care | Payer: Medicaid Other | Attending: Obstetrics & Gynecology | Admitting: Obstetrics & Gynecology

## 2021-10-17 DIAGNOSIS — Z3A14 14 weeks gestation of pregnancy: Secondary | ICD-10-CM | POA: Diagnosis not present

## 2021-10-17 DIAGNOSIS — O368120 Decreased fetal movements, second trimester, not applicable or unspecified: Secondary | ICD-10-CM | POA: Insufficient documentation

## 2021-10-17 DIAGNOSIS — O210 Mild hyperemesis gravidarum: Secondary | ICD-10-CM | POA: Insufficient documentation

## 2021-10-17 DIAGNOSIS — O99612 Diseases of the digestive system complicating pregnancy, second trimester: Secondary | ICD-10-CM | POA: Diagnosis not present

## 2021-10-17 DIAGNOSIS — O26892 Other specified pregnancy related conditions, second trimester: Secondary | ICD-10-CM | POA: Insufficient documentation

## 2021-10-17 DIAGNOSIS — K219 Gastro-esophageal reflux disease without esophagitis: Secondary | ICD-10-CM | POA: Insufficient documentation

## 2021-10-17 MED ORDER — ALUM & MAG HYDROXIDE-SIMETH 200-200-20 MG/5ML PO SUSP
30.0000 mL | Freq: Once | ORAL | Status: AC
  Administered 2021-10-17: 30 mL via ORAL
  Filled 2021-10-17: qty 30

## 2021-10-17 MED ORDER — FAMOTIDINE 20 MG PO TABS: 20.0000 mg | ORAL_TABLET | Freq: Every day | ORAL | 6 refills | Status: DC

## 2021-10-17 MED ORDER — ONDANSETRON 4 MG PO TBDP: 4.0000 mg | ORAL_TABLET | Freq: Three times a day (TID) | ORAL | 0 refills | Status: DC | PRN

## 2021-10-17 MED ORDER — FAMOTIDINE 20 MG PO TABS
40.0000 mg | ORAL_TABLET | Freq: Once | ORAL | Status: AC
  Administered 2021-10-17: 40 mg via ORAL
  Filled 2021-10-17: qty 2

## 2021-10-17 NOTE — MAU Provider Note (Signed)
History     CSN: [id number]  Arrival date and time: 10/17/21 0851   Event Date/Time   First Provider Initiated Contact with Patient 10/17/21 1010      Chief Complaint  Patient presents with   burning in abdomen   Decreased Fetal Movement   25 y.o. [id number] @14 .6 wks presenting with upper abdominal pain and N/V. Reports onset of intermittent, burning, upper abdominal pain in the epigastric region. Pain started 3 days ago. Rates pain 7/10. Has not treated. Reports pain is worse after a meal. Admits to salty, greasy foods lately such as [employer] and lunch meats such as salami and ham. She often has vomiting after these foods. She has vomiting almost daily. She has nausea meds but doesn't use d/t  drowsiness. Reports hx of reflux in her previous pregnancy during the last trimester. Denies LAP, cramping, VB, or discharge. Denies urinary sx.     OB History     Gravida  5   Para  1   Term  1   Preterm      AB  3   Living  1      SAB  2   IAB  1   Ectopic      Multiple      Live Births  1           Past Medical History:  Diagnosis Date   History of blood transfusion    d/t miscarriage   History of ovarian cyst    Hypothyroidism    followed by pcp   Ruptured ovarian cyst 01/08/2018   Sickle cell trait [facility])     Past Surgical History:  Procedure Laterality Date   DILATION AND CURETTAGE OF UTERUS     July 2022   DILATION AND EVACUATION N/A 05/07/2021   Procedure: DILATATION AND EVACUATION, PARACERVICAL BLOCK;  Surgeon: Lavonia Drafts, MD;  Location: [place];  Service: Gynecology;  Laterality: N/A;    Family History  Problem Relation Age of Onset   Heart disease Mother    Arthritis Mother     Social History   Tobacco Use   Smoking status: Never   Smokeless tobacco: Never  Vaping Use   Vaping Use: Never used  Substance Use Topics   Alcohol use: Not Currently   Drug use: Never    Allergies: No Known Allergies  Medications Prior to  Admission  Medication Sig Dispense Refill Last Dose   levothyroxine (SYNTHROID) 200 MCG tablet Take 1 tablet (200 mcg total) by mouth daily before breakfast. 90 tablet 1    magnesium oxide (MAG-OX) 400 (240 Mg) MG tablet Take 250 mg by mouth daily.      Prenat-FeAsp-Meth-FA-DHA w/o A (PRENATE PIXIE) 10-0.6-0.4-200 MG CAPS Take 1 capsule by mouth daily. 9 capsule 0    promethazine (PHENERGAN) 25 MG tablet Take 1 tablet (25 mg total) by mouth every 6 (six) hours as needed for nausea or vomiting. 30 tablet 0     Review of Systems  Gastrointestinal:  Positive for abdominal pain, nausea and vomiting.  Genitourinary:  Negative for dysuria, vaginal bleeding and vaginal discharge.   Physical Exam   Blood pressure 96/75, pulse 88, temperature 98.1 F (36.7 C), resp. rate 16, weight 80 kg, last menstrual period 07/05/2021, SpO2 99 %, unknown if currently breastfeeding.  Physical Exam Vitals and nursing note reviewed.  Constitutional:      General: She is not in acute distress.    Appearance: Normal appearance.  HENT:  Head: Normocephalic and atraumatic.  Cardiovascular:     Rate and Rhythm: Normal rate.  Pulmonary:     Effort: Pulmonary effort is normal. No respiratory distress.  Abdominal:     General: There is no distension.     Palpations: Abdomen is soft. There is no mass.     Tenderness: There is no abdominal tenderness. There is no guarding or rebound.     Hernia: No hernia is present.  Musculoskeletal:        General: Normal range of motion.     Cervical back: Normal range of motion.  Skin:    General: Skin is warm and dry.  Neurological:     General: No focal deficit present.     Mental Status: She is alert and oriented to person, place, and time.  Psychiatric:        Mood and Affect: Mood normal.        Behavior: Behavior normal.   FHT 149  No results found for this or any previous visit (from the past 24 hour(s)).  MAU Course  Procedures Pepcid Maalox  MDM Sx  consistent with GERD, discussed medical and dietary management. Tolerating po after meds,no vomiting, sx improved. Stable for discharge home.   Assessment and Plan   1. [redacted] weeks gestation of pregnancy   2. Gastroesophageal reflux disease without esophagitis   3. Morning sickness    Discharge home Follow up at [facility] as scheduled Rx Pepcid Rx Zofran GERD diet  Allergies as of 10/17/2021   No Known Allergies      Medication List     STOP taking these medications    promethazine 25 MG tablet Commonly known as: PHENERGAN       TAKE these medications    famotidine 20 MG tablet Commonly known as: PEPCID Take 1 tablet (20 mg total) by mouth at bedtime.   levothyroxine 200 MCG tablet Commonly known as: SYNTHROID Take 1 tablet (200 mcg total) by mouth daily before breakfast.   magnesium oxide 400 (240 Mg) MG tablet Commonly known as: MAG-OX Take 250 mg by mouth daily.   ondansetron 4 MG disintegrating tablet Commonly known as: ZOFRAN-ODT Take 1 tablet (4 mg total) by mouth every 8 (eight) hours as needed for nausea or vomiting.   Prenate Pixie 10-0.6-0.4-200 MG Caps Take 1 capsule by mouth daily.        Donette Larry, CNM 10/17/2021, 10:41 AM

## 2021-10-17 NOTE — MAU Note (Signed)
.  Victoria Miller is a 26 y.o. at [redacted]w[redacted]d here in MAU reporting: noticed burning in her abdomen for the last week that comes and goes. States she has also been throwing up throughout her pregnancy and has nausea medicine but hasn't taken it because it makes her sleepy. Denies VB or abnormal discharge. Also concerned because she has felt the baby move but has been unable to feel it since yesterday.   Pain score: 7 Vitals:   10/17/21 0911  BP: 96/75  Pulse: 88  Resp: 16  Temp: 98.1 F (36.7 C)  SpO2: 99%     FHT:149 Lab orders placed from triage:  none

## 2021-10-28 ENCOUNTER — Inpatient Hospital Stay (HOSPITAL_COMMUNITY)
Admission: AD | Admit: 2021-10-28 | Discharge: 2021-10-28 | Disposition: A | Discharge status: Home / Self Care | Payer: Medicaid Other | Attending: Family Medicine | Admitting: Family Medicine

## 2021-10-28 DIAGNOSIS — O26892 Other specified pregnancy related conditions, second trimester: Secondary | ICD-10-CM | POA: Diagnosis present

## 2021-10-28 DIAGNOSIS — Z3A16 16 weeks gestation of pregnancy: Secondary | ICD-10-CM | POA: Diagnosis not present

## 2021-10-28 DIAGNOSIS — Z1152 Encounter for screening for COVID-19: Secondary | ICD-10-CM | POA: Insufficient documentation

## 2021-10-28 DIAGNOSIS — R6889 Other general symptoms and signs: Secondary | ICD-10-CM | POA: Diagnosis not present

## 2021-10-28 DIAGNOSIS — O09899 Supervision of other high risk pregnancies, unspecified trimester: Secondary | ICD-10-CM

## 2021-10-28 LAB — RESP PANEL BY RT-PCR (FLU A&B, COVID) ARPGX2
Influenza A by PCR: NEGATIVE
Influenza B by PCR: NEGATIVE
SARS Coronavirus 2 by RT PCR: NEGATIVE

## 2021-10-28 NOTE — MAU Note (Signed)
.  Victoria Miller is a 26 y.o. at [redacted]w[redacted]d here in MAU reporting: On Friday she started having cold symptoms and on Saturday she noticed that she had runny nose, cough, headache, and lower abdominal soreness from coughing. She wanted to come in to make sure everything was okay with the baby and see what she could take for her symptoms. Denies VB or LOF.   Pain score: 8 (head and abdomen) Vitals:   10/28/21 0957  BP: 130/69  Pulse: (!) 101  Resp: 14  Temp: 98 F (36.7 C)  SpO2: 100%     FHT:141 Lab orders placed from triage:  Covid, Flu A & B

## 2021-10-28 NOTE — Discharge Instructions (Signed)

## 2021-10-28 NOTE — MAU Provider Note (Signed)
   Event Date/Time   First Provider Initiated Contact with Patient 10/28/21 1006      S [name] is a 26 y.o. [id number] patient who presents to MAU today with complaint of flu-like symptoms.  However, she would just like to "make sure the baby is okay."  Patient states everyone in her household is sick and she started having sneezing on Friday.  She states on Saturday her cough, runny nose, and headache started.  Patient denies SOB and reports some dizziness, but states she is unable to eat much.  However, she is getting fluids.    O BP 130/69 (BP Location: Right Arm)   Pulse (!) 101   Temp 98 F (36.7 C)   Resp 14   Wt 78.8 kg   LMP 07/05/2021 (Approximate)   SpO2 100%   BMI 24.24 kg/m  Physical Exam Vitals reviewed.  Constitutional:      Appearance: Normal appearance. She is well-developed.  HENT:     Head: Normocephalic and atraumatic.  Eyes:     Conjunctiva/sclera: Conjunctivae normal.  Cardiovascular:     Rate and Rhythm: Normal rate.  Pulmonary:     Effort: Pulmonary effort is normal. No respiratory distress.     Breath sounds: Examination of the right-upper field reveals wheezing. Examination of the left-upper field reveals wheezing. Wheezing present.     Comments: Wheezes clear with cough Skin:    General: Skin is warm and dry.  Neurological:     Mental Status: She is alert and oriented to person, place, and time.  Psychiatric:        Mood and Affect: Mood normal.        Behavior: Behavior normal.     A Flu-Like Symptoms 16.3 weeks MSE Complete  P -Discussed return precautions -Covid/Flu swab completed.  Informed that results will be released to mychart. -Instructed to treat symptoms as appropriate. -Preg. Safe Med list given. -Discharge from MAU in stable condition  Gavin Pound, CNM 10/28/2021 10:14 AM

## 2021-10-31 ENCOUNTER — Encounter: Payer: Self-pay | Admitting: Family Medicine

## 2021-11-06 ENCOUNTER — Encounter: Payer: Medicaid Other | Admitting: Family Medicine

## 2021-11-20 ENCOUNTER — Other Ambulatory Visit: Payer: Self-pay | Admitting: *Deleted

## 2021-11-20 ENCOUNTER — Ambulatory Visit: Payer: Medicaid Other | Admitting: *Deleted

## 2021-11-20 ENCOUNTER — Encounter: Payer: Self-pay | Admitting: *Deleted

## 2021-11-20 ENCOUNTER — Ambulatory Visit: Payer: Medicaid Other | Attending: Family Medicine

## 2021-11-20 VITALS — BP 133/69 | HR 77

## 2021-11-20 DIAGNOSIS — Z362 Encounter for other antenatal screening follow-up: Secondary | ICD-10-CM

## 2021-11-20 DIAGNOSIS — D573 Sickle-cell trait: Secondary | ICD-10-CM

## 2021-11-20 DIAGNOSIS — O09899 Supervision of other high risk pregnancies, unspecified trimester: Secondary | ICD-10-CM

## 2021-11-20 DIAGNOSIS — E039 Hypothyroidism, unspecified: Secondary | ICD-10-CM

## 2021-11-27 ENCOUNTER — Encounter: Payer: Medicaid Other | Admitting: Obstetrics and Gynecology

## 2021-12-04 ENCOUNTER — Ambulatory Visit (INDEPENDENT_AMBULATORY_CARE_PROVIDER_SITE_OTHER): Payer: Medicaid Other | Admitting: Obstetrics and Gynecology

## 2021-12-04 VITALS — BP 121/73 | HR 93 | Wt 180.0 lb

## 2021-12-04 DIAGNOSIS — Z3A21 21 weeks gestation of pregnancy: Secondary | ICD-10-CM | POA: Diagnosis not present

## 2021-12-04 DIAGNOSIS — Z3482 Encounter for supervision of other normal pregnancy, second trimester: Secondary | ICD-10-CM

## 2021-12-04 DIAGNOSIS — Z3492 Encounter for supervision of normal pregnancy, unspecified, second trimester: Secondary | ICD-10-CM | POA: Diagnosis not present

## 2021-12-04 NOTE — Progress Notes (Signed)
   PRENATAL VISIT NOTE  Subjective:  Victoria Miller is a 26 y.o. G5P1031 at [redacted]w[redacted]d being seen today for ongoing prenatal care.  She is currently monitored for the following issues for this low-risk pregnancy and has Sickle cell trait (HCC); Hypothyroidism; Bacterial vaginosis; Abnormal hemoglobin (HCC); Missed abortion with fetal demise before 20 completed weeks of gestation; and Supervision of other high risk pregnancy, antepartum on their problem list.  Patient reports  intermittent/rate cramping .  Contractions: Not present. Vag. Bleeding: None.  Movement: Present. Denies leaking of fluid.   The following portions of the patient's history were reviewed and updated as appropriate: allergies, current medications, past family history, past medical history, past social history, past surgical history and problem list.   Objective:   Vitals:   12/04/21 1004  BP: 121/73  Pulse: 93  Weight: 180 lb (81.6 kg)    Fetal Status: Fetal Heart Rate (bpm): 154 Fundal Height: 21 cm Movement: Present     General:  Alert, oriented and cooperative. Patient is in no acute distress.  Skin: Skin is warm and dry. No rash noted.   Cardiovascular: Normal heart rate noted  Respiratory: Normal respiratory effort, no problems with respiration noted  Abdomen: Soft, gravid, appropriate for gestational age.  Pain/Pressure: Absent     Pelvic: Cervical exam deferred        Extremities: Normal range of motion.  Edema: None  Mental Status: Normal mood and affect. Normal behavior. Normal judgment and thought content.   Assessment and Plan:  Pregnancy: G5P1031 at [redacted]w[redacted]d 1. [redacted] weeks gestation of pregnancy Doing well FH and FHR normal  - AFP, Serum, Open Spina Bifida  Preterm labor symptoms and general obstetric precautions including but not limited to vaginal bleeding, contractions, leaking of fluid and fetal movement were reviewed in detail with the patient. Please refer to After Visit Summary for other counseling  recommendations.   No follow-ups on file.  Future Appointments  Date Time Provider Department Center  01/01/2022  9:30 AM [facility] NURSE [facility]  01/01/2022  9:45 AM [facility] US6 [facility]  01/02/2022  8:35 AM Levie Heritage, DO [facility] None  01/15/2022 10:55 AM Levie Heritage, DO [facility] None  01/29/2022  9:15 AM Adrian Blackwater, Rhona Raider, DO [facility] None    Lorriane Shire, MD

## 2021-12-04 NOTE — Patient Instructions (Signed)
Take your nausea medicine as needed. Try to increased how much water you are drinking.

## 2021-12-06 LAB — AFP, SERUM, OPEN SPINA BIFIDA
AFP MoM: 2.72
AFP Value: 186 ng/mL
Gest. Age on Collection Date: 21.5 weeks
Maternal Age At EDD: 26.3 yr
OSBR Risk 1 IN: 383
Test Results:: NEGATIVE
Weight: 180 [lb_av]

## 2021-12-13 ENCOUNTER — Encounter: Payer: Self-pay | Admitting: Family Medicine

## 2021-12-24 ENCOUNTER — Encounter: Payer: Self-pay | Admitting: Family Medicine

## 2021-12-24 MED ORDER — LOPERAMIDE HCL 2 MG PO TABS: 2.0000 mg | ORAL_TABLET | Freq: Four times a day (QID) | ORAL | 0 refills | Status: DC | PRN

## 2021-12-24 MED ORDER — ONDANSETRON 4 MG PO TBDP: 4.0000 mg | ORAL_TABLET | Freq: Three times a day (TID) | ORAL | 0 refills | Status: DC | PRN

## 2021-12-25 MED ORDER — PROMETHAZINE HCL 25 MG PO TABS: 25.0000 mg | ORAL_TABLET | Freq: Four times a day (QID) | ORAL | 2 refills | Status: DC | PRN

## 2021-12-25 NOTE — Addendum Note (Signed)
Addended by: Levie Heritage on: 12/25/2021 11:54 AM   Modules accepted: Orders

## 2021-12-26 DIAGNOSIS — Z3A24 24 weeks gestation of pregnancy: Secondary | ICD-10-CM | POA: Diagnosis not present

## 2021-12-26 DIAGNOSIS — O2312 Infections of bladder in pregnancy, second trimester: Secondary | ICD-10-CM | POA: Diagnosis not present

## 2022-01-01 ENCOUNTER — Ambulatory Visit: Payer: Medicaid Other | Attending: Maternal & Fetal Medicine

## 2022-01-01 ENCOUNTER — Ambulatory Visit: Payer: Medicaid Other | Admitting: *Deleted

## 2022-01-01 VITALS — BP 137/73 | HR 76

## 2022-01-01 DIAGNOSIS — Z3A25 25 weeks gestation of pregnancy: Secondary | ICD-10-CM

## 2022-01-01 DIAGNOSIS — Z362 Encounter for other antenatal screening follow-up: Secondary | ICD-10-CM | POA: Diagnosis present

## 2022-01-01 DIAGNOSIS — O09899 Supervision of other high risk pregnancies, unspecified trimester: Secondary | ICD-10-CM | POA: Diagnosis present

## 2022-01-01 DIAGNOSIS — O99012 Anemia complicating pregnancy, second trimester: Secondary | ICD-10-CM | POA: Diagnosis not present

## 2022-01-01 DIAGNOSIS — O99280 Endocrine, nutritional and metabolic diseases complicating pregnancy, unspecified trimester: Secondary | ICD-10-CM | POA: Diagnosis not present

## 2022-01-01 DIAGNOSIS — E039 Hypothyroidism, unspecified: Secondary | ICD-10-CM | POA: Insufficient documentation

## 2022-01-01 DIAGNOSIS — D573 Sickle-cell trait: Secondary | ICD-10-CM | POA: Insufficient documentation

## 2022-01-01 DIAGNOSIS — O26892 Other specified pregnancy related conditions, second trimester: Secondary | ICD-10-CM | POA: Diagnosis not present

## 2022-01-01 DIAGNOSIS — O99019 Anemia complicating pregnancy, unspecified trimester: Secondary | ICD-10-CM | POA: Diagnosis present

## 2022-01-01 DIAGNOSIS — O99282 Endocrine, nutritional and metabolic diseases complicating pregnancy, second trimester: Secondary | ICD-10-CM

## 2022-01-01 DIAGNOSIS — O09292 Supervision of pregnancy with other poor reproductive or obstetric history, second trimester: Secondary | ICD-10-CM | POA: Diagnosis not present

## 2022-01-01 DIAGNOSIS — Z8279 Family history of other congenital malformations, deformations and chromosomal abnormalities: Secondary | ICD-10-CM

## 2022-01-01 DIAGNOSIS — Y999 Unspecified external cause status: Secondary | ICD-10-CM | POA: Diagnosis not present

## 2022-01-01 DIAGNOSIS — R1030 Lower abdominal pain, unspecified: Secondary | ICD-10-CM | POA: Diagnosis not present

## 2022-01-02 ENCOUNTER — Ambulatory Visit (INDEPENDENT_AMBULATORY_CARE_PROVIDER_SITE_OTHER): Payer: Medicaid Other | Admitting: Family Medicine

## 2022-01-02 ENCOUNTER — Encounter: Payer: Self-pay | Admitting: General Practice

## 2022-01-02 VITALS — BP 112/65 | HR 80 | Wt 186.0 lb

## 2022-01-02 DIAGNOSIS — O09892 Supervision of other high risk pregnancies, second trimester: Secondary | ICD-10-CM | POA: Diagnosis not present

## 2022-01-02 DIAGNOSIS — E039 Hypothyroidism, unspecified: Secondary | ICD-10-CM | POA: Diagnosis not present

## 2022-01-02 DIAGNOSIS — Z3A25 25 weeks gestation of pregnancy: Secondary | ICD-10-CM | POA: Diagnosis not present

## 2022-01-02 DIAGNOSIS — O09899 Supervision of other high risk pregnancies, unspecified trimester: Secondary | ICD-10-CM

## 2022-01-02 DIAGNOSIS — Z3492 Encounter for supervision of normal pregnancy, unspecified, second trimester: Secondary | ICD-10-CM | POA: Diagnosis not present

## 2022-01-02 DIAGNOSIS — D573 Sickle-cell trait: Secondary | ICD-10-CM

## 2022-01-02 NOTE — Addendum Note (Signed)
Addended by: Lorelle Gibbs L on: 01/02/2022 10:41 AM   Modules accepted: Orders
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
## 2022-01-03 LAB — CBC
Hematocrit: 30.3 % — ABNORMAL LOW (ref 34.0–46.6)
Hemoglobin: 10.4 g/dL — ABNORMAL LOW (ref 11.1–15.9)
MCH: 30.5 pg (ref 26.6–33.0)
MCHC: 34.3 g/dL (ref 31.5–35.7)
MCV: 89 fL (ref 79–97)
Platelets: 213 10*3/uL (ref 150–450)
RBC: 3.41 x10E6/uL — ABNORMAL LOW (ref 3.77–5.28)
RDW: 12.1 % (ref 11.7–15.4)
WBC: 7.8 10*3/uL (ref 3.4–10.8)

## 2022-01-03 LAB — HIV ANTIBODY (ROUTINE TESTING W REFLEX): HIV Screen 4th Generation wRfx: NONREACTIVE

## 2022-01-03 LAB — TSH: TSH: 0.881 u[IU]/mL (ref 0.450–4.500)

## 2022-01-03 LAB — RPR: RPR Ser Ql: NONREACTIVE

## 2022-01-06 NOTE — L&D Delivery Note (Addendum)
LABOR COURSE Initial SVE 4.5cm. Augmented with pitocin. Pain control with epidural.  Delivery Note Called to room and patient was complete and pushing. Head delivered LOA. No nuchal cord present. Shoulder and body delivered in usual fashion. At  29 a healthy female was delivered via Vaginal, Spontaneous (Presentation: cephalic; LOA).  Infant with spontaneous cry, placed on mother's abdomen, dried and stimulated. Cord clamped x 2 after 1-minute delay, and cut by FOB. Cord blood drawn. Placenta delivered spontaneously with gentle cord traction. Appears intact. Fundus firm with massage and IV Pitocin.   Labia, perineum, vagina, and cervix inspected with 1st degree peri-urethral, 2nd degree perineal, multiple upper labial excoriations identified.     APGAR: 9, 9; weight pending.   Cord: 3VC  Cord pH: NA  Anesthesia:  epidural Episiotomy: None Lacerations: 1st degree peri-urethral, 2nd degree perineal repaired in the usual fashion.  Suture Repair:  4.0 vicryl Est. Blood Loss (mL): 159  Mom to postpartum.  Baby to Couplet care / Skin to Skin.  Enid Baas, MD 04/07/22 9:29 AM

## 2022-01-08 ENCOUNTER — Other Ambulatory Visit: Payer: Medicaid Other

## 2022-01-08 DIAGNOSIS — O09899 Supervision of other high risk pregnancies, unspecified trimester: Secondary | ICD-10-CM | POA: Diagnosis not present

## 2022-01-08 NOTE — Progress Notes (Signed)
Patient sent to lab for one hr gtt Kathrene Alu RN

## 2022-01-09 LAB — GLUCOSE TOLERANCE, 1 HOUR: Glucose, 1Hr PP: 88 mg/dL (ref 70–199)

## 2022-01-15 ENCOUNTER — Ambulatory Visit (INDEPENDENT_AMBULATORY_CARE_PROVIDER_SITE_OTHER): Payer: Medicaid Other | Admitting: Family Medicine

## 2022-01-15 VITALS — BP 116/71 | HR 88 | Wt 188.0 lb

## 2022-01-15 DIAGNOSIS — O99013 Anemia complicating pregnancy, third trimester: Secondary | ICD-10-CM

## 2022-01-15 DIAGNOSIS — Z3A00 Weeks of gestation of pregnancy not specified: Secondary | ICD-10-CM | POA: Diagnosis not present

## 2022-01-15 DIAGNOSIS — O9A211 Injury, poisoning and certain other consequences of external causes complicating pregnancy, first trimester: Secondary | ICD-10-CM | POA: Diagnosis not present

## 2022-01-15 DIAGNOSIS — S8002XA Contusion of left knee, initial encounter: Secondary | ICD-10-CM | POA: Diagnosis not present

## 2022-01-15 DIAGNOSIS — O09899 Supervision of other high risk pregnancies, unspecified trimester: Secondary | ICD-10-CM

## 2022-01-15 DIAGNOSIS — Y92410 Unspecified street and highway as the place of occurrence of the external cause: Secondary | ICD-10-CM | POA: Diagnosis not present

## 2022-01-15 DIAGNOSIS — O99012 Anemia complicating pregnancy, second trimester: Secondary | ICD-10-CM

## 2022-01-15 DIAGNOSIS — Z3A27 27 weeks gestation of pregnancy: Secondary | ICD-10-CM

## 2022-01-15 DIAGNOSIS — E039 Hypothyroidism, unspecified: Secondary | ICD-10-CM

## 2022-01-15 DIAGNOSIS — O09892 Supervision of other high risk pregnancies, second trimester: Secondary | ICD-10-CM

## 2022-01-15 MED ORDER — BISACODYL 5 MG PO TBEC: 5.0000 mg | DELAYED_RELEASE_TABLET | Freq: Every day | ORAL | 3 refills | Status: DC | PRN

## 2022-01-15 NOTE — Progress Notes (Signed)
   PRENATAL VISIT NOTE  Subjective:  Victoria Miller is a 27 y.o. [id number] at [redacted]w[redacted]d being seen today for ongoing prenatal care.  She is currently monitored for the following issues for this high-risk pregnancy and has Sickle cell trait ([place]); Hypothyroidism; Bacterial vaginosis; Abnormal hemoglobin ([place]); Missed abortion with fetal demise before 22 completed weeks of gestation; and Supervision of other high risk pregnancy, antepartum on their problem list.  Patient reports  constipation .  Contractions: Not present. Vag. Bleeding: None.  Movement: Present. Denies leaking of fluid.   The following portions of the patient's history were reviewed and updated as appropriate: allergies, current medications, past family history, past medical history, past social history, past surgical history and problem list.   Objective:   Vitals:   01/15/22 1050  BP: 116/71  Pulse: 88  Weight: 188 lb (85.3 kg)    Fetal Status: Fetal Heart Rate (bpm): 130   Movement: Present     General:  Alert, oriented and cooperative. Patient is in no acute distress.  Skin: Skin is warm and dry. No rash noted.   Cardiovascular: Normal heart rate noted  Respiratory: Normal respiratory effort, no problems with respiration noted  Abdomen: Soft, gravid, appropriate for gestational age.  Pain/Pressure: Present     Pelvic: Cervical exam deferred        Extremities: Normal range of motion.  Edema: None  Mental Status: Normal mood and affect. Normal behavior. Normal judgment and thought content.   Assessment and Plan:  Pregnancy: [id number] at [redacted]w[redacted]d 1. [redacted] weeks gestation of pregnancy  2. Supervision of other high risk pregnancy, antepartum FHT and FH normal  3. Acquired hypothyroidism TSH last visit normal.   4. Anemia in pregnancy, third trimester Started iron today. Will send in stool softener for constipation  Preterm labor symptoms and general obstetric precautions including but not limited to vaginal bleeding,  contractions, leaking of fluid and fetal movement were reviewed in detail with the patient. Please refer to After Visit Summary for other counseling recommendations.   No follow-ups on file.  Future Appointments  Date Time Provider [place]  01/29/2022  9:15 AM Truett Mainland, DO [facility] None  02/12/2022 10:15 AM Truett Mainland, DO [facility] None  02/26/2022 10:15 AM Truett Mainland, DO [facility] None    Truett Mainland, DO

## 2022-01-17 ENCOUNTER — Telehealth: Payer: Self-pay | Admitting: *Deleted

## 2022-01-17 NOTE — Patient Outreach (Signed)
  Care Coordination [facility] Note Transition Care Management Unsuccessful Follow-up Telephone Call  Date of discharge and from where:  01/15/22 from [place]  Attempts:  1st Attempt  Reason for unsuccessful TCM follow-up call:  Left voice message   Lurena Joiner RN, BSN [place] RN Care Coordinator

## 2022-01-22 DIAGNOSIS — M25562 Pain in left knee: Secondary | ICD-10-CM | POA: Diagnosis not present

## 2022-01-29 ENCOUNTER — Ambulatory Visit (INDEPENDENT_AMBULATORY_CARE_PROVIDER_SITE_OTHER): Payer: Medicaid Other | Admitting: Family Medicine

## 2022-01-29 ENCOUNTER — Encounter: Payer: Self-pay | Admitting: Family Medicine

## 2022-01-29 VITALS — BP 107/80 | HR 100 | Wt 187.0 lb

## 2022-01-29 DIAGNOSIS — O09893 Supervision of other high risk pregnancies, third trimester: Secondary | ICD-10-CM

## 2022-01-29 DIAGNOSIS — Z3A29 29 weeks gestation of pregnancy: Secondary | ICD-10-CM

## 2022-01-29 DIAGNOSIS — O09899 Supervision of other high risk pregnancies, unspecified trimester: Secondary | ICD-10-CM

## 2022-01-29 DIAGNOSIS — D573 Sickle-cell trait: Secondary | ICD-10-CM

## 2022-01-29 DIAGNOSIS — E039 Hypothyroidism, unspecified: Secondary | ICD-10-CM

## 2022-01-29 DIAGNOSIS — D508 Other iron deficiency anemias: Secondary | ICD-10-CM

## 2022-01-29 MED ORDER — BISACODYL 5 MG PO TBEC: 5.0000 mg | DELAYED_RELEASE_TABLET | Freq: Every day | ORAL | 3 refills | Status: DC | PRN

## 2022-01-29 NOTE — Progress Notes (Signed)
   PRENATAL VISIT NOTE  Subjective:  Victoria Miller is a 27 y.o. [id number] at [redacted]w[redacted]d being seen today for ongoing prenatal care.  She is currently monitored for the following issues for this high-risk pregnancy and has Sickle cell trait ([place]); Hypothyroidism; Bacterial vaginosis; Abnormal hemoglobin ([place]); Missed abortion with fetal demise before 69 completed weeks of gestation; and Supervision of other high risk pregnancy, antepartum on their problem list.  Patient reports  feeling tired .  Contractions: Not present. Vag. Bleeding: None.  Movement: Present. Denies leaking of fluid.   The following portions of the patient's history were reviewed and updated as appropriate: allergies, current medications, past family history, past medical history, past social history, past surgical history and problem list.   Objective:   Vitals:   01/29/22 0909  BP: 107/80  Pulse: 100  Weight: 187 lb (84.8 kg)    Fetal Status: Fetal Heart Rate (bpm): 150 Fundal Height: 29 cm Movement: Present  Presentation: Vertex  General:  Alert, oriented and cooperative. Patient is in no acute distress.  Skin: Skin is warm and dry. No rash noted.   Cardiovascular: Normal heart rate noted  Respiratory: Normal respiratory effort, no problems with respiration noted  Abdomen: Soft, gravid, appropriate for gestational age.  Pain/Pressure: Absent     Pelvic: Cervical exam deferred        Extremities: Normal range of motion.  Edema: None  Mental Status: Normal mood and affect. Normal behavior. Normal judgment and thought content.   Assessment and Plan:  Pregnancy: [id number] at [redacted]w[redacted]d 1. [redacted] weeks gestation of pregnancy  2. Supervision of other high risk pregnancy, antepartum FHT and FH normal  3. Acquired hypothyroidism TSH normal last week  4. Sickle cell trait ([place])  5. Iron deficiency anemia secondary to inadequate dietary iron intake Taking iron. Needs stool softener - prescribed.  Preterm labor symptoms and  general obstetric precautions including but not limited to vaginal bleeding, contractions, leaking of fluid and fetal movement were reviewed in detail with the patient. Please refer to After Visit Summary for other counseling recommendations.   No follow-ups on file.  Future Appointments  Date Time Provider [place]  02/12/2022 10:15 AM Truett Mainland, DO [facility] None  02/26/2022 10:15 AM Truett Mainland, DO [facility] None  03/12/2022 10:15 AM Truett Mainland, DO [facility] None  03/19/2022 10:15 AM Truett Mainland, DO [facility] None  03/26/2022 10:15 AM Truett Mainland, DO [facility] None  04/02/2022 10:15 AM Truett Mainland, DO [facility] None    Truett Mainland, DO

## 2022-01-31 ENCOUNTER — Telehealth: Payer: Self-pay

## 2022-01-31 ENCOUNTER — Inpatient Hospital Stay (HOSPITAL_COMMUNITY)
Admission: AD | Admit: 2022-01-31 | Discharge: 2022-01-31 | Disposition: A | Discharge status: Home / Self Care | Payer: Medicaid Other | Attending: Obstetrics & Gynecology | Admitting: Obstetrics & Gynecology

## 2022-01-31 ENCOUNTER — Encounter (HOSPITAL_COMMUNITY): Payer: Self-pay | Admitting: Obstetrics & Gynecology

## 2022-01-31 DIAGNOSIS — O368130 Decreased fetal movements, third trimester, not applicable or unspecified: Secondary | ICD-10-CM | POA: Insufficient documentation

## 2022-01-31 DIAGNOSIS — Z3A30 30 weeks gestation of pregnancy: Secondary | ICD-10-CM | POA: Diagnosis not present

## 2022-01-31 DIAGNOSIS — R102 Pelvic and perineal pain: Secondary | ICD-10-CM | POA: Insufficient documentation

## 2022-01-31 DIAGNOSIS — O4703 False labor before 37 completed weeks of gestation, third trimester: Secondary | ICD-10-CM

## 2022-01-31 DIAGNOSIS — O26893 Other specified pregnancy related conditions, third trimester: Secondary | ICD-10-CM | POA: Insufficient documentation

## 2022-01-31 LAB — URINALYSIS, ROUTINE W REFLEX MICROSCOPIC
Bilirubin Urine: NEGATIVE
Glucose, UA: NEGATIVE mg/dL
Hgb urine dipstick: NEGATIVE
Ketones, ur: NEGATIVE mg/dL
Nitrite: NEGATIVE
Protein, ur: NEGATIVE mg/dL
Specific Gravity, Urine: 1.012 (ref 1.005–1.030)
pH: 8 (ref 5.0–8.0)

## 2022-01-31 LAB — FETAL FIBRONECTIN: Fetal Fibronectin: NEGATIVE

## 2022-01-31 NOTE — Discharge Instructions (Signed)
Return to MAU for more than 6 PAINFUL contractions in 1 hour 

## 2022-01-31 NOTE — MAU Note (Signed)
.  Victoria Miller is a 27 y.o. at [redacted]w[redacted]d here in MAU reporting: cramping since last pm, since 4 am pain worsening. Having a lot of vaginal pain and pressure. Denies bleeding or ROM. Reports ? Fetal movement.   Onset of complaint: last pm Pain score: 10/10 Vitals:   01/31/22 1147  BP: 113/72  Pulse: 94  Resp: 18  Temp: 98.5 F (36.9 C)  SpO2: 100%     FHT:134 Lab orders placed from triage:

## 2022-01-31 NOTE — Telephone Encounter (Signed)
Patient is [redacted] weeks pregnant and is having cramping every few minutes since 5 am. Patient was in the shower this morning and had to sit down because cramping go that intense. Patient laid in bed til about 9 am and it has not subsided. Patient states baby is moving well. Instructed patient to go to hospital MAU now for evaluation. Patient agrees. Kathrene Alu RN

## 2022-01-31 NOTE — MAU Provider Note (Signed)
History     CSN: [id number]  Arrival date and time: 01/31/22 1138   Event Date/Time   First Provider Initiated Contact with Patient 01/31/22 1217      Chief Complaint  Patient presents with   Abdominal Pain   Decreased Fetal Movement   Pelvic Pain   HPI [name] is a 27 y.o. year old [id number] female at [redacted]w[redacted]d weeks gestation who presents to MAU reporting lower abdominal cramping/pain since last night. She took a bath hoping that it would go away. She reports at 0400 increased cramping/pain and vaginal pressure. She took a shower without relief. She reports DFM since the cramping started. She reports SI yesterday morning. She admits to "not drinking water, but I drink soda all day." She receives [facility] with [facility]; next appt is 02/12/2022.   OB History     Gravida  5   Para  1   Term  1   Preterm      AB  3   Living  1      SAB  2   IAB  1   Ectopic      Multiple      Live Births  1           Past Medical History:  Diagnosis Date   History of blood transfusion    d/t miscarriage   History of ovarian cyst    Hypothyroidism    followed by pcp   Ruptured ovarian cyst 01/08/2018   Sickle cell trait [place] [facility])     Past Surgical History:  Procedure Laterality Date   DILATION AND CURETTAGE OF UTERUS     July 2022   DILATION AND EVACUATION N/A 05/07/2021   Procedure: DILATATION AND EVACUATION, PARACERVICAL BLOCK;  Surgeon: Lavonia Drafts, MD;  Location: [place];  Service: Gynecology;  Laterality: N/A;    Family History  Problem Relation Age of Onset   Heart disease Mother    Arthritis Mother     Social History   Tobacco Use   Smoking status: Never   Smokeless tobacco: Never  Vaping Use   Vaping Use: Never used  Substance Use Topics   Alcohol use: Not Currently   Drug use: Never    Allergies: No Known Allergies  No medications prior to admission.    Review of Systems  Constitutional: Negative.   HENT: Negative.    Eyes:  Negative.   Respiratory: Negative.    Cardiovascular: Negative.   Gastrointestinal: Negative.   Endocrine: Negative.   Genitourinary:  Positive for pelvic pain (cramping, vaginal pressure).       ?DFM  Musculoskeletal: Negative.   Skin: Negative.   Allergic/Immunologic: Negative.   Neurological: Negative.   Hematological: Negative.   Psychiatric/Behavioral: Negative.     Physical Exam   Blood pressure 116/64, pulse 79, temperature 98.5 F (36.9 C), temperature source Oral, resp. rate 17, height 5\' 11"  (1.803 m), weight 85 kg, last menstrual period 07/05/2021, SpO2 100 %, unknown if currently breastfeeding.  Physical Exam Vitals and nursing note reviewed.  Constitutional:      Appearance: Normal appearance. She is obese.  Musculoskeletal:        General: Normal range of motion.  Skin:    General: Skin is warm and dry.  Neurological:     Mental Status: She is alert and oriented to person, place, and time.  Psychiatric:        Mood and Affect: Mood normal.        Behavior:  Behavior normal.        Thought Content: Thought content normal.        Judgment: Judgment normal.    REACTIVE NST - FHR: 135 bpm / moderate variability / accels present / decels absent / TOCO: UI noted   MAU Course  Procedures  MDM CCUA fFN  Results for orders placed or performed during the hospital encounter of 01/31/22 (from the past 24 hour(s))  Urinalysis, Routine w reflex microscopic -Urine, Clean Catch     Status: Abnormal   Collection Time: 01/31/22 12:06 PM  Result Value Ref Range   Color, Urine YELLOW YELLOW   APPearance HAZY (A) CLEAR   Specific Gravity, Urine 1.012 1.005 - 1.030   pH 8.0 5.0 - 8.0   Glucose, UA NEGATIVE NEGATIVE mg/dL   Hgb urine dipstick NEGATIVE NEGATIVE   Bilirubin Urine NEGATIVE NEGATIVE   Ketones, ur NEGATIVE NEGATIVE mg/dL   Protein, ur NEGATIVE NEGATIVE mg/dL   Nitrite NEGATIVE NEGATIVE   Leukocytes,Ua TRACE (A) NEGATIVE   RBC / HPF 0-5 0 - 5 RBC/hpf    WBC, UA 0-5 0 - 5 WBC/hpf   Bacteria, UA RARE (A) NONE SEEN   Squamous Epithelial / HPF 0-5 0 - 5 /HPF   Mucus PRESENT   Fetal fibronectin     Status: None   Collection Time: 01/31/22 12:30 PM  Result Value Ref Range   Fetal Fibronectin NEGATIVE NEGATIVE    Assessment and Plan  1. Preterm uterine contractions in third trimester, antepartum - Advised to drink more water daily - Recommend drinking 2 cups of for every cup of soda she consumes  2. [redacted] weeks gestation of pregnancy   - Discharge patient - Keep scheduled appt with [facility] - Patient verbalized an understanding of the plan of care and agrees.  [name], CNM 01/31/2022, 1:48 PM

## 2022-02-03 MED ORDER — NIFEDIPINE ER OSMOTIC RELEASE 30 MG PO TB24: 30.0000 mg | ORAL_TABLET | Freq: Every day | ORAL | 2 refills | Status: DC

## 2022-02-03 NOTE — Addendum Note (Signed)
Addended by: Truett Mainland on: 02/03/2022 07:09 PM   Modules accepted: Orders

## 2022-02-12 ENCOUNTER — Ambulatory Visit (INDEPENDENT_AMBULATORY_CARE_PROVIDER_SITE_OTHER): Payer: Medicaid Other | Admitting: Obstetrics & Gynecology

## 2022-02-12 ENCOUNTER — Encounter: Payer: Medicaid Other | Admitting: Family Medicine

## 2022-02-12 VITALS — BP 129/79 | HR 75 | Wt 187.0 lb

## 2022-02-12 DIAGNOSIS — O09893 Supervision of other high risk pregnancies, third trimester: Secondary | ICD-10-CM

## 2022-02-12 DIAGNOSIS — D573 Sickle-cell trait: Secondary | ICD-10-CM

## 2022-02-12 DIAGNOSIS — O09899 Supervision of other high risk pregnancies, unspecified trimester: Secondary | ICD-10-CM

## 2022-02-12 DIAGNOSIS — E038 Other specified hypothyroidism: Secondary | ICD-10-CM

## 2022-02-12 DIAGNOSIS — Z3A31 31 weeks gestation of pregnancy: Secondary | ICD-10-CM

## 2022-02-12 NOTE — Progress Notes (Signed)
   PRENATAL VISIT NOTE  Subjective:  Victoria Miller is a 27 y.o. [id number] at [redacted]w[redacted]d being seen today for ongoing prenatal care.  She is currently monitored for the following issues for this high-risk pregnancy and has Sickle cell trait (Victoria Miller); Hypothyroidism; Bacterial vaginosis; Abnormal hemoglobin (Victoria Miller); Missed abortion with fetal demise before 55 completed weeks of gestation; and Supervision of other high risk pregnancy, antepartum on their problem list.  Patient reports no contractions for the past 3-4 days post MAU visit.  Contractions: Irritability. Vag. Bleeding: None.  Movement: Present. Denies leaking of fluid.   The following portions of the patient's history were reviewed and updated as appropriate: allergies, current medications, past family history, past medical history, past social history, past surgical history and problem list.   Objective:   Vitals:   02/12/22 1359  BP: 129/79  Pulse: 75  Weight: 187 lb (84.8 kg)    Fetal Status: Fetal Heart Rate (bpm): 138   Movement: Present     General:  Alert, oriented and cooperative. Patient is in no acute distress.  Skin: Skin is warm and dry. No rash noted.   Cardiovascular: Normal heart rate noted  Respiratory: Normal respiratory effort, no problems with respiration noted  Abdomen: Soft, gravid, appropriate for gestational age.  Pain/Pressure: Present     Pelvic: Cervical exam deferred        Extremities: Normal range of motion.  Edema: Trace  Mental Status: Normal mood and affect. Normal behavior. Normal judgment and thought content.   Assessment and Plan:  Pregnancy: [id number] at [redacted]w[redacted]d 1. [redacted] weeks gestation of pregnancy Pt was seen in the MAU last week for ctx that have resolved.   2. Supervision of other high risk pregnancy, antepartum FH and FHR wnl  3. Sickle cell trait (Victoria Miller)  4. Other specified hypothyroidism Last TSH was WNL  Preterm labor symptoms and general obstetric precautions including but not limited to  vaginal bleeding, contractions, leaking of fluid and fetal movement were reviewed in detail with the patient. Please refer to After Visit Summary for other counseling recommendations.   Return in about 2 weeks (around 02/26/2022).  Future Appointments  Date Time Provider [place]  02/26/2022 10:15 AM Truett Mainland, DO [facility] None  03/12/2022 10:15 AM Truett Mainland, DO [facility] None  03/19/2022 10:15 AM Truett Mainland, DO [facility] None  03/26/2022 10:15 AM Truett Mainland, DO [facility] None  04/02/2022 10:15 AM Nehemiah Settle, Tanna Savoy, DO [facility] None    Lavonia Drafts, MD

## 2022-02-26 ENCOUNTER — Ambulatory Visit (INDEPENDENT_AMBULATORY_CARE_PROVIDER_SITE_OTHER): Payer: Medicaid Other | Admitting: Family Medicine

## 2022-02-26 VITALS — BP 112/66 | HR 92 | Wt 190.0 lb

## 2022-02-26 DIAGNOSIS — E039 Hypothyroidism, unspecified: Secondary | ICD-10-CM

## 2022-02-26 DIAGNOSIS — Z3A33 33 weeks gestation of pregnancy: Secondary | ICD-10-CM

## 2022-02-26 DIAGNOSIS — D508 Other iron deficiency anemias: Secondary | ICD-10-CM

## 2022-02-26 DIAGNOSIS — O09899 Supervision of other high risk pregnancies, unspecified trimester: Secondary | ICD-10-CM

## 2022-02-26 DIAGNOSIS — O09893 Supervision of other high risk pregnancies, third trimester: Secondary | ICD-10-CM

## 2022-02-26 NOTE — Progress Notes (Signed)
   PRENATAL VISIT NOTE  Subjective:  Victoria Miller is a 27 y.o. [id number] at 47w5dbeing seen today for ongoing prenatal care.  She is currently monitored for the following issues for this low-risk pregnancy and has Sickle cell trait ([place]; Hypothyroidism; Bacterial vaginosis; Abnormal hemoglobin ([place]; Missed abortion with fetal demise before 252completed weeks of gestation; and Supervision of other high risk pregnancy, antepartum on their problem list.  Patient reports no complaints.  Contractions: Irritability. Vag. Bleeding: None.  Movement: Present. Denies leaking of fluid.   The following portions of the patient's history were reviewed and updated as appropriate: allergies, current medications, past family history, past medical history, past social history, past surgical history and problem list.   Objective:   Vitals:   02/26/22 1013  BP: 112/66  Pulse: 92  Weight: 190 lb (86.2 kg)    Fetal Status: Fetal Heart Rate (bpm): 151   Movement: Present     General:  Alert, oriented and cooperative. Patient is in no acute distress.  Skin: Skin is warm and dry. No rash noted.   Cardiovascular: Normal heart rate noted  Respiratory: Normal respiratory effort, no problems with respiration noted  Abdomen: Soft, gravid, appropriate for gestational age.  Pain/Pressure: Present     Pelvic: Cervical exam deferred        Extremities: Normal range of motion.  Edema: None  Mental Status: Normal mood and affect. Normal behavior. Normal judgment and thought content.   Assessment and Plan:  Pregnancy: [id number] 341w5d. [redacted] weeks gestation of pregnancy  2. Supervision of other high risk pregnancy, antepartum FHT and FH normal  3. Acquired hypothyroidism Check TSH today TSH has been normal On levothyroxine - TSH  4. Iron deficiency anemia secondary to inadequate dietary iron intake On iron  Preterm labor symptoms and general obstetric precautions including but not limited to vaginal  bleeding, contractions, leaking of fluid and fetal movement were reviewed in detail with the patient. Please refer to After Visit Summary for other counseling recommendations.   No follow-ups on file.  Future Appointments  Date Time Provider [place]/06/2022 10:15 AM StTruett MainlandDO [facility] None  03/19/2022 10:15 AM StTruett MainlandDO [facility] None  03/26/2022 10:15 AM StTruett MainlandDO [facility] None  04/02/2022 10:15 AM StTruett MainlandDO [facility] None    JaTruett MainlandDO

## 2022-02-27 ENCOUNTER — Other Ambulatory Visit: Payer: Self-pay | Admitting: Family Medicine

## 2022-02-27 LAB — TSH: TSH: 3.77 u[IU]/mL (ref 0.450–4.500)

## 2022-02-27 MED ORDER — LEVOTHYROXINE SODIUM 50 MCG PO TABS: 50.0000 ug | ORAL_TABLET | Freq: Every day | ORAL | 1 refills | Status: DC

## 2022-03-10 ENCOUNTER — Encounter (HOSPITAL_COMMUNITY): Payer: Self-pay | Admitting: Family Medicine

## 2022-03-10 ENCOUNTER — Inpatient Hospital Stay (HOSPITAL_COMMUNITY)
Admission: AD | Admit: 2022-03-10 | Discharge: 2022-03-10 | Disposition: A | Discharge status: Home / Self Care | Payer: Medicaid Other | Attending: Family Medicine | Admitting: Family Medicine

## 2022-03-10 DIAGNOSIS — O4703 False labor before 37 completed weeks of gestation, third trimester: Secondary | ICD-10-CM | POA: Diagnosis not present

## 2022-03-10 DIAGNOSIS — Z1152 Encounter for screening for COVID-19: Secondary | ICD-10-CM | POA: Diagnosis not present

## 2022-03-10 DIAGNOSIS — O99613 Diseases of the digestive system complicating pregnancy, third trimester: Secondary | ICD-10-CM | POA: Insufficient documentation

## 2022-03-10 DIAGNOSIS — Z3A35 35 weeks gestation of pregnancy: Secondary | ICD-10-CM | POA: Diagnosis not present

## 2022-03-10 DIAGNOSIS — K529 Noninfective gastroenteritis and colitis, unspecified: Secondary | ICD-10-CM | POA: Diagnosis not present

## 2022-03-10 LAB — COMPREHENSIVE METABOLIC PANEL
ALT: 14 U/L (ref 0–44)
AST: 23 U/L (ref 15–41)
Albumin: 2.7 g/dL — ABNORMAL LOW (ref 3.5–5.0)
Alkaline Phosphatase: 124 U/L (ref 38–126)
Anion gap: 10 (ref 5–15)
BUN: <5 mg/dL — ABNORMAL LOW (ref 6–20)
CO2: 23 mmol/L (ref 22–32)
Calcium: 8.8 mg/dL — ABNORMAL LOW (ref 8.9–10.3)
Chloride: 103 mmol/L (ref 98–111)
Creatinine, Ser: 0.62 mg/dL (ref 0.44–1.00)
GFR, Estimated: >60 mL/min (ref >60)
Glucose, Bld: 74 mg/dL (ref 70–99)
Potassium: 3.7 mmol/L (ref 3.5–5.1)
Sodium: 136 mmol/L (ref 135–145)
Total Bilirubin: 1 mg/dL (ref 0.3–1.2)
Total Protein: 6.6 g/dL (ref 6.5–8.1)

## 2022-03-10 LAB — CBC WITH DIFFERENTIAL/PLATELET
Abs Immature Granulocytes: 0.09 10*3/uL — ABNORMAL HIGH (ref 0.00–0.07)
Basophils Absolute: 0 10*3/uL (ref 0.0–0.1)
Basophils Relative: 0 %
Eosinophils Absolute: 0.2 10*3/uL (ref 0.0–0.5)
Eosinophils Relative: 2 %
HCT: 32.9 % — ABNORMAL LOW (ref 36.0–46.0)
Hemoglobin: 11.3 g/dL — ABNORMAL LOW (ref 12.0–15.0)
Immature Granulocytes: 1 %
Lymphocytes Relative: 7 %
Lymphs Abs: 0.8 10*3/uL (ref 0.7–4.0)
MCH: 30.5 pg (ref 26.0–34.0)
MCHC: 34.3 g/dL (ref 30.0–36.0)
MCV: 88.7 fL (ref 80.0–100.0)
Monocytes Absolute: 0.6 10*3/uL (ref 0.1–1.0)
Monocytes Relative: 6 %
Neutro Abs: 9.3 10*3/uL — ABNORMAL HIGH (ref 1.7–7.7)
Neutrophils Relative %: 84 %
Platelets: 185 10*3/uL (ref 150–400)
RBC: 3.71 MIL/uL — ABNORMAL LOW (ref 3.87–5.11)
RDW: 13.2 % (ref 11.5–15.5)
WBC: 11 10*3/uL — ABNORMAL HIGH (ref 4.0–10.5)
nRBC: 0 % (ref 0.0–0.2)

## 2022-03-10 LAB — RESP PANEL BY RT-PCR (RSV, FLU A&B, COVID)  RVPGX2
Influenza A by PCR: NEGATIVE
Influenza B by PCR: NEGATIVE
Resp Syncytial Virus by PCR: NEGATIVE
SARS Coronavirus 2 by RT PCR: NEGATIVE

## 2022-03-10 LAB — URINALYSIS, ROUTINE W REFLEX MICROSCOPIC
Bilirubin Urine: NEGATIVE
Glucose, UA: NEGATIVE mg/dL
Hgb urine dipstick: NEGATIVE
Ketones, ur: 80 mg/dL — AB
Leukocytes,Ua: NEGATIVE
Nitrite: NEGATIVE
Protein, ur: NEGATIVE mg/dL
Specific Gravity, Urine: 1.016 (ref 1.005–1.030)
pH: 7 (ref 5.0–8.0)

## 2022-03-10 MED ORDER — NIFEDIPINE 10 MG PO CAPS
10.0000 mg | ORAL_CAPSULE | ORAL | Status: AC | PRN
  Administered 2022-03-10 (×3): 10 mg via ORAL
  Filled 2022-03-10 (×3): qty 1

## 2022-03-10 MED ORDER — SODIUM CHLORIDE 0.9 % IV SOLN
8.0000 mg | Freq: Once | INTRAVENOUS | Status: AC
  Administered 2022-03-10: 8 mg via INTRAVENOUS
  Filled 2022-03-10: qty 4

## 2022-03-10 MED ORDER — LACTATED RINGERS IV BOLUS
1000.0000 mL | Freq: Once | INTRAVENOUS | Status: AC
  Administered 2022-03-10: 1000 mL via INTRAVENOUS

## 2022-03-10 MED ORDER — TERBUTALINE SULFATE 1 MG/ML IJ SOLN
0.2500 mg | Freq: Once | INTRAMUSCULAR | Status: AC
  Administered 2022-03-10: 0.25 mg via SUBCUTANEOUS
  Filled 2022-03-10: qty 1

## 2022-03-10 NOTE — MAU Provider Note (Signed)
History    CSN: [id number]  Arrival date and time: 03/10/22 1300   None     Chief Complaint  Patient presents with   Contractions   Nausea   HPI Victoria Miller is a 27yo [id number] at 27w3dpresenting today with nausea, vomiting and contractions. The pt stated that her contractions started on Thursday but somewhat resolved on Saturday. Pt states that she ate Sunday dinner made by her father in law who had been sick last week. She woke up with nausea, vomiting and contractions this morning and has been not able to keep anything down. Pt stated that she tried to eat some toast with butter this AM but was unable to keep that down. Pt has been feeling dizzy since this AM. Pt declined LOF, or vaginal bleeding. Pt reports good fetal movement.    OB History     Gravida  5   Para  1   Term  1   Preterm      AB  3   Living  1      SAB  2   IAB  1   Ectopic      Multiple      Live Births  1           Past Medical History:  Diagnosis Date   History of blood transfusion    d/t miscarriage   History of ovarian cyst    Hypothyroidism    followed by pcp   Ruptured ovarian cyst 01/08/2018   Sickle cell trait ([facility]     Past Surgical History:  Procedure Laterality Date   DILATION AND CURETTAGE OF UTERUS     July 2022   DILATION AND EVACUATION N/A 05/07/2021   Procedure: DILATATION AND EVACUATION, PARACERVICAL BLOCK;  Surgeon: HLavonia Drafts MD;  [place]  Service: Gynecology;  Laterality: N/A;    Family History  Problem Relation Age of Onset   Heart disease Mother    Arthritis Mother     Social History   Tobacco Use   Smoking status: Never   Smokeless tobacco: Never  Vaping Use   Vaping Use: Never used  Substance Use Topics   Alcohol use: Not Currently   Drug use: Never    Allergies: No Known Allergies  Medications Prior to Admission  Medication Sig Dispense Refill Last Dose   famotidine (PEPCID) 20 MG tablet Take 1 tablet (20 mg  total) by mouth at bedtime. 30 tablet 6 03/10/2022   ferrous sulfate 325 (65 FE) MG EC tablet Take 325 mg by mouth daily with breakfast. Patient is taking 1 tablet every other day.   03/10/2022   levothyroxine (SYNTHROID) 50 MCG tablet Take 1 tablet (50 mcg total) by mouth daily before breakfast. 60 tablet 1 03/10/2022   bisacodyl 5 MG EC tablet Take 1 tablet (5 mg total) by mouth daily as needed for moderate constipation. 30 tablet 3    levothyroxine (SYNTHROID) 200 MCG tablet Take 1 tablet (200 mcg total) by mouth daily before breakfast. 90 tablet 1    magnesium oxide (MAG-OX) 400 (240 Mg) MG tablet Take 250 mg by mouth daily.      ondansetron (ZOFRAN-ODT) 4 MG disintegrating tablet Take 1 tablet (4 mg total) by mouth every 8 (eight) hours as needed for nausea or vomiting. 20 tablet 0    Prenat-FeAsp-Meth-FA-DHA w/o A (PRENATE PIXIE) 10-0.6-0.4-200 MG CAPS Take 1 capsule by mouth daily. 9 capsule 0    promethazine (PHENERGAN) 25 MG tablet Take  1 tablet (25 mg total) by mouth every 6 (six) hours as needed for nausea or vomiting. 30 tablet 2     Review of Systems  Constitutional:  Negative for fever.  Gastrointestinal:  Positive for nausea and vomiting.  Genitourinary:  Negative for vaginal bleeding.  Neurological:  Positive for dizziness and light-headedness.    Physical Exam   Blood pressure 125/78, pulse (!) 108, temperature 99 F (37.2 C), resp. rate 18, height '5\' 11"'$  (1.803 m), weight 87.1 kg, last menstrual period 07/05/2021, unknown if currently breastfeeding.  Physical Exam Pulmonary:     Effort: Pulmonary effort is normal.  Abdominal:     Tenderness: There is no guarding.  Neurological:     General: No focal deficit present.     Mental Status: She is alert and oriented to person, place, and time. Mental status is at baseline.  Psychiatric:        Mood and Affect: Mood normal.        Behavior: Behavior normal.     MAU Course  Procedures  MDM Labs: CBC with Differential/  Platelet Comprehensive metabolic panel  Urinalysis with reflex microscope- urine clean catch Medications: - Lactated ringer bolus 1,000 ml  -Ondansetron (Zofran) '8mg'$  in sodium chloride 0.9% 39m IVPB Cervical check: 0/ thick  Pt received Nifedipine '10mg'$  x 3, and Terbutaline with cessation of contractions  Assessment and Plan   1. [redacted] weeks gestation of pregnancy   2. Gastroenteritis   3. Threatened premature labor in third trimester    Patient has promethazine at home. Declines further antiemetics. Stable for discharge to home. Will follow-up on Wednesday in office.   Nicolaus Andel 03/10/2022, 2:21 PM

## 2022-03-10 NOTE — MAU Note (Signed)
.  Victoria Miller is a 27 y.o. at 60w3dhere in MAU reporting: abd pain/ctx started yesterday morning . Started having N/V this morning (thoinks she might have eaten something) Good fetal movement felt. Denies any vag bleeding or leaking.Pt stated she feels weak like she  will pass out.  LMP:  Onset of complaint: yesterday Pain score: 10 Vitals:   03/10/22 1327  BP: 125/78  Pulse: (!) 108  Resp: 18  Temp: 99 F (37.2 C)     FHT:150 Lab orders placed from triage:

## 2022-03-12 ENCOUNTER — Ambulatory Visit (INDEPENDENT_AMBULATORY_CARE_PROVIDER_SITE_OTHER): Payer: Medicaid Other | Admitting: Family Medicine

## 2022-03-12 VITALS — BP 121/78 | HR 84 | Wt 193.0 lb

## 2022-03-12 DIAGNOSIS — O09893 Supervision of other high risk pregnancies, third trimester: Secondary | ICD-10-CM

## 2022-03-12 DIAGNOSIS — O09899 Supervision of other high risk pregnancies, unspecified trimester: Secondary | ICD-10-CM

## 2022-03-12 DIAGNOSIS — Z3A35 35 weeks gestation of pregnancy: Secondary | ICD-10-CM

## 2022-03-12 DIAGNOSIS — E039 Hypothyroidism, unspecified: Secondary | ICD-10-CM

## 2022-03-12 DIAGNOSIS — D573 Sickle-cell trait: Secondary | ICD-10-CM

## 2022-03-12 NOTE — Progress Notes (Signed)
   PRENATAL VISIT NOTE  Subjective:  Victoria Miller is a 27 y.o. [id number] at 94w5dbeing seen today for ongoing prenatal care.  She is currently monitored for the following issues for this high-risk pregnancy and has Sickle cell trait ([place]; Hypothyroidism; Bacterial vaginosis; Abnormal hemoglobin ([place]; Missed abortion with fetal demise before 261completed weeks of gestation; and Supervision of other high risk pregnancy, antepartum on their problem list.  Patient reports  feeling better. No contractions .  Contractions: Irregular. Vag. Bleeding: None.  Movement: Present. Denies leaking of fluid.   The following portions of the patient's history were reviewed and updated as appropriate: allergies, current medications, past family history, past medical history, past social history, past surgical history and problem list.   Objective:   Vitals:   03/12/22 1033  BP: 121/78  Pulse: 84  Weight: 193 lb (87.5 kg)    Fetal Status: Fetal Heart Rate (bpm): 130   Movement: Present     General:  Alert, oriented and cooperative. Patient is in no acute distress.  Skin: Skin is warm and dry. No rash noted.   Cardiovascular: Normal heart rate noted  Respiratory: Normal respiratory effort, no problems with respiration noted  Abdomen: Soft, gravid, appropriate for gestational age.  Pain/Pressure: Present     Pelvic: Cervical exam deferred        Extremities: Normal range of motion.  Edema: None  Mental Status: Normal mood and affect. Normal behavior. Normal judgment and thought content.   Assessment and Plan:  Pregnancy: [id number] 313w5d. Supervision of other high risk pregnancy, antepartum FHT and FH normal  2. Acquired hypothyroidism On levothyroxine 25036mdaily  3. Sickle cell trait (HCCDuneanPreterm labor symptoms and general obstetric precautions including but not limited to vaginal bleeding, contractions, leaking of fluid and fetal movement were reviewed in detail with the patient. Please  refer to After Visit Summary for other counseling recommendations.   No follow-ups on file.  Future Appointments  Date Time Provider [place]/13/2024 10:15 AM StiTruett MainlandO [facility] None  03/26/2022 10:15 AM StiTruett MainlandO [facility] None  04/02/2022 10:15 AM StiTruett MainlandO [facility] None    JacTruett MainlandO

## 2022-03-19 ENCOUNTER — Inpatient Hospital Stay (HOSPITAL_COMMUNITY)
Admission: AD | Admit: 2022-03-19 | Discharge: 2022-03-19 | Disposition: A | Discharge status: Home / Self Care | Payer: Medicaid Other | Attending: Obstetrics and Gynecology | Admitting: Obstetrics and Gynecology

## 2022-03-19 ENCOUNTER — Other Ambulatory Visit (HOSPITAL_COMMUNITY)
Admission: RE | Admit: 2022-03-19 | Discharge: 2022-03-19 | Disposition: A | Discharge status: Home / Self Care | Payer: Medicaid Other | Source: Ambulatory Visit | Attending: Family Medicine | Admitting: Family Medicine

## 2022-03-19 ENCOUNTER — Encounter (HOSPITAL_COMMUNITY): Payer: Self-pay | Admitting: Obstetrics and Gynecology

## 2022-03-19 ENCOUNTER — Ambulatory Visit (INDEPENDENT_AMBULATORY_CARE_PROVIDER_SITE_OTHER): Payer: Medicaid Other | Admitting: Family Medicine

## 2022-03-19 VITALS — BP 116/70 | HR 79 | Wt 196.0 lb

## 2022-03-19 DIAGNOSIS — R42 Dizziness and giddiness: Secondary | ICD-10-CM | POA: Diagnosis not present

## 2022-03-19 DIAGNOSIS — O26893 Other specified pregnancy related conditions, third trimester: Secondary | ICD-10-CM | POA: Insufficient documentation

## 2022-03-19 DIAGNOSIS — O09893 Supervision of other high risk pregnancies, third trimester: Secondary | ICD-10-CM

## 2022-03-19 DIAGNOSIS — D573 Sickle-cell trait: Secondary | ICD-10-CM

## 2022-03-19 DIAGNOSIS — Z3A36 36 weeks gestation of pregnancy: Secondary | ICD-10-CM | POA: Insufficient documentation

## 2022-03-19 DIAGNOSIS — O09899 Supervision of other high risk pregnancies, unspecified trimester: Secondary | ICD-10-CM

## 2022-03-19 DIAGNOSIS — O212 Late vomiting of pregnancy: Secondary | ICD-10-CM | POA: Insufficient documentation

## 2022-03-19 DIAGNOSIS — Z3689 Encounter for other specified antenatal screening: Secondary | ICD-10-CM | POA: Diagnosis not present

## 2022-03-19 DIAGNOSIS — R112 Nausea with vomiting, unspecified: Secondary | ICD-10-CM

## 2022-03-19 DIAGNOSIS — Z3493 Encounter for supervision of normal pregnancy, unspecified, third trimester: Secondary | ICD-10-CM | POA: Diagnosis not present

## 2022-03-19 DIAGNOSIS — E039 Hypothyroidism, unspecified: Secondary | ICD-10-CM

## 2022-03-19 LAB — CBC
HCT: 29.8 % — ABNORMAL LOW (ref 36.0–46.0)
Hemoglobin: 10.3 g/dL — ABNORMAL LOW (ref 12.0–15.0)
MCH: 30.8 pg (ref 26.0–34.0)
MCHC: 34.6 g/dL (ref 30.0–36.0)
MCV: 89.2 fL (ref 80.0–100.0)
Platelets: 170 10*3/uL (ref 150–400)
RBC: 3.34 MIL/uL — ABNORMAL LOW (ref 3.87–5.11)
RDW: 13.5 % (ref 11.5–15.5)
WBC: 7.5 10*3/uL (ref 4.0–10.5)
nRBC: 0 % (ref 0.0–0.2)

## 2022-03-19 LAB — URINALYSIS, ROUTINE W REFLEX MICROSCOPIC
Bilirubin Urine: NEGATIVE
Glucose, UA: NEGATIVE mg/dL
Hgb urine dipstick: NEGATIVE
Ketones, ur: NEGATIVE mg/dL
Nitrite: NEGATIVE
Protein, ur: NEGATIVE mg/dL
Specific Gravity, Urine: 1.004 — ABNORMAL LOW (ref 1.005–1.030)
pH: 7 (ref 5.0–8.0)

## 2022-03-19 LAB — COMPREHENSIVE METABOLIC PANEL
ALT: 22 U/L (ref 0–44)
AST: 34 U/L (ref 15–41)
Albumin: 2.4 g/dL — ABNORMAL LOW (ref 3.5–5.0)
Alkaline Phosphatase: 130 U/L — ABNORMAL HIGH (ref 38–126)
Anion gap: 8 (ref 5–15)
BUN: <5 mg/dL — ABNORMAL LOW (ref 6–20)
CO2: 24 mmol/L (ref 22–32)
Calcium: 8.6 mg/dL — ABNORMAL LOW (ref 8.9–10.3)
Chloride: 105 mmol/L (ref 98–111)
Creatinine, Ser: 0.54 mg/dL (ref 0.44–1.00)
GFR, Estimated: >60 mL/min (ref >60)
Glucose, Bld: 86 mg/dL (ref 70–99)
Potassium: 3.5 mmol/L (ref 3.5–5.1)
Sodium: 137 mmol/L (ref 135–145)
Total Bilirubin: 0.3 mg/dL (ref 0.3–1.2)
Total Protein: 6.1 g/dL — ABNORMAL LOW (ref 6.5–8.1)

## 2022-03-19 MED ORDER — ONDANSETRON HCL 4 MG/2ML IJ SOLN
4.0000 mg | Freq: Once | INTRAMUSCULAR | Status: AC
  Administered 2022-03-19: 4 mg via INTRAVENOUS
  Filled 2022-03-19: qty 2

## 2022-03-19 MED ORDER — MECLIZINE HCL 25 MG PO TABS: 25.0000 mg | ORAL_TABLET | Freq: Three times a day (TID) | ORAL | 0 refills | Status: DC | PRN

## 2022-03-19 MED ORDER — LACTATED RINGERS IV BOLUS
1000.0000 mL | Freq: Once | INTRAVENOUS | Status: AC
  Administered 2022-03-19: 1000 mL via INTRAVENOUS

## 2022-03-19 NOTE — MAU Provider Note (Signed)
History     CSN: [id number]  Arrival date and time: 03/19/22 0436   Event Date/Time   First Provider Initiated Contact with Patient 03/19/22 [phone]      Chief Complaint  Patient presents with   Nausea   Dizziness   Emesis During Pregnancy   HPI [name] is a 27 y.o. year old [id number] female at 18w5dweeks gestation who presents to MAU reporting N/V and dizziness. She started vomiting around 0300 and she states "the room started spinning." She also has some lower abdominal cramping; rated 2/10. [facility]20[facility]. Her FOB is present and contributing to the history taking.    OB History     Gravida  5   Para  1   Term  1   Preterm      AB  3   Living  1      SAB  2   IAB  1   Ectopic      Multiple      Live Births  1           Past Medical History:  Diagnosis Date   History of blood transfusion    d/t miscarriage   History of ovarian cyst    Hypothyroidism    followed by pcp   Ruptured ovarian cyst 01/08/2018   Sickle cell trait ([facility]     Past Surgical History:  Procedure Laterality Date   DILATION AND CURETTAGE OF UTERUS     July 2022   DILATION AND EVACUATION N/A 05/07/2021   Procedure: DILATATION AND EVACUATION, PARACERVICAL BLOCK;  Surgeon: HLavonia Drafts MD;  [place]  Service: Gynecology;  Laterality: N/A;    Family History  Problem Relation Age of Onset   Heart disease Mother    Arthritis Mother     Social History   Tobacco Use   Smoking status: Never   Smokeless tobacco: Never  Vaping Use   Vaping Use: Never used  Substance Use Topics   Alcohol use: Not Currently   Drug use: Never    Allergies: No Known Allergies  No medications prior to admission.    Review of Systems  Constitutional: Negative.   HENT: Negative.    Eyes: Negative.   Respiratory: Negative.    Cardiovascular: Negative.   Gastrointestinal:  Positive for nausea and vomiting.   Endocrine: Negative.   Genitourinary: Negative.   Musculoskeletal: Negative.   Skin: Negative.   Allergic/Immunologic: Negative.   Neurological:  Positive for dizziness.  Hematological: Negative.   Psychiatric/Behavioral: Negative.     Physical Exam   Blood pressure 119/73, pulse 64, temperature 98.5 F (36.9 C), resp. rate 17, height '5\' 11"'$  (1.803 m), weight 86.2 kg, last menstrual period 07/05/2021, SpO2 100 %, unknown if currently breastfeeding.  Physical Exam Vitals and nursing note reviewed.  Constitutional:      Appearance: Normal appearance. She is normal weight.  Cardiovascular:     Rate and Rhythm: Normal rate.  Pulmonary:     Effort: Pulmonary effort is normal.  Abdominal:     Palpations: Abdomen is soft.  Genitourinary:    Comments: Not indicated Musculoskeletal:        General: Normal range of motion.  Skin:    General: Skin is warm and dry.  Neurological:     Mental Status: She is alert and oriented to person, place, and time.  Psychiatric:        Mood and Affect:  Mood normal.        Behavior: Behavior normal.        Thought Content: Thought content normal.        Judgment: Judgment normal.    REACTIVE NST - FHR: 125 bpm / moderate variability / accels present / decels absent / TOCO: Occ irregular UCs and UI noted on tracing  MAU Course  Procedures  MDM CCUA CBC CMP LR Bolus 1000 ml @ 999 ml/hr Zofran 4 mg IVP -- improved nausea  Results for orders placed or performed during the hospital encounter of 03/19/22 (from the past 24 hour(s))  CBC     Status: Abnormal   Collection Time: 03/19/22  5:16 AM  Result Value Ref Range   WBC 7.5 4.0 - 10.5 K/uL   RBC 3.34 (L) 3.87 - 5.11 MIL/uL   Hemoglobin 10.3 (L) 12.0 - 15.0 g/dL   HCT 29.8 (L) 36.0 - 46.0 %   MCV 89.2 80.0 - 100.0 fL   MCH 30.8 26.0 - 34.0 pg   MCHC 34.6 30.0 - 36.0 g/dL   RDW 13.5 11.5 - 15.5 %   Platelets 170 150 - 400 K/uL   nRBC 0.0 0.0 - 0.2 %  Comprehensive metabolic panel      Status: Abnormal   Collection Time: 03/19/22  5:16 AM  Result Value Ref Range   Sodium 137 135 - 145 mmol/L   Potassium 3.5 3.5 - 5.1 mmol/L   Chloride 105 98 - 111 mmol/L   CO2 24 22 - 32 mmol/L   Glucose, Bld 86 70 - 99 mg/dL   BUN <5 (L) 6 - 20 mg/dL   Creatinine, Ser 0.54 0.44 - 1.00 mg/dL   Calcium 8.6 (L) 8.9 - 10.3 mg/dL   Total Protein 6.1 (L) 6.5 - 8.1 g/dL   Albumin 2.4 (L) 3.5 - 5.0 g/dL   AST 34 15 - 41 U/L   ALT 22 0 - 44 U/L   Alkaline Phosphatase 130 (H) 38 - 126 U/L   Total Bilirubin 0.3 0.3 - 1.2 mg/dL   GFR, Estimated >60 >60 mL/min   Anion gap 8 5 - 15  Urinalysis, Routine w reflex microscopic -Urine, Clean Catch     Status: Abnormal   Collection Time: 03/19/22  6:36 AM  Result Value Ref Range   Color, Urine STRAW (A) YELLOW   APPearance CLEAR CLEAR   Specific Gravity, Urine 1.004 (L) 1.005 - 1.030   pH 7.0 5.0 - 8.0   Glucose, UA NEGATIVE NEGATIVE mg/dL   Hgb urine dipstick NEGATIVE NEGATIVE   Bilirubin Urine NEGATIVE NEGATIVE   Ketones, ur NEGATIVE NEGATIVE mg/dL   Protein, ur NEGATIVE NEGATIVE mg/dL   Nitrite NEGATIVE NEGATIVE   Leukocytes,Ua TRACE (A) NEGATIVE   RBC / HPF 0-5 0 - 5 RBC/hpf   WBC, UA 0-5 0 - 5 WBC/hpf   Bacteria, UA FEW (A) NONE SEEN   Squamous Epithelial / HPF 0-5 0 - 5 /HPF    * Reassessment @ [id number]: Patient states the dizziness is much better than it was upon arrival to MAU.  Assessment and Plan  1. Vertigo - Information provided on vertigo - Rx: Meclizine 25 mg 1 po every 8 hrs   2. Nausea and vomiting, unspecified vomiting type - Advised to take antiemetics that she already has at home, if nausea continues  3. [redacted] weeks gestation of pregnancy   - Discharge patient - Keep scheduled appt with [facility] later today - Patient verbalized an understanding  of the plan of care and agrees.   [name], CNM 03/19/2022, 6:39 AM

## 2022-03-19 NOTE — Progress Notes (Signed)
   PRENATAL VISIT NOTE  Subjective:  Victoria Miller is a 27 y.o. [id number] at [redacted]w[redacted]d being seen today for ongoing prenatal care.  She is currently monitored for the following issues for this high-risk pregnancy and has Sickle cell trait ([place]); Hypothyroidism; Bacterial vaginosis; Abnormal hemoglobin ([place]); Missed abortion with fetal demise before 69 completed weeks of gestation; and Supervision of other high risk pregnancy, antepartum on their problem list.  Patient reports occasional contractions.  Contractions: Not present. Vag. Bleeding: None.  Movement: Present. Denies leaking of fluid.   The following portions of the patient's history were reviewed and updated as appropriate: allergies, current medications, past family history, past medical history, past social history, past surgical history and problem list.   Objective:   Vitals:   03/19/22 1009  BP: 116/70  Pulse: 79  Weight: 196 lb (88.9 kg)    Fetal Status: Fetal Heart Rate (bpm): 138 Fundal Height: 37 cm Movement: Present  Presentation: Vertex  General:  Alert, oriented and cooperative. Patient is in no acute distress.  Skin: Skin is warm and dry. No rash noted.   Cardiovascular: Normal heart rate noted  Respiratory: Normal respiratory effort, no problems with respiration noted  Abdomen: Soft, gravid, appropriate for gestational age.  Pain/Pressure: Present     Pelvic: Cervical exam performed in the presence of a chaperone Dilation: Fingertip Effacement (%): Thick    Extremities: Normal range of motion.  Edema: Trace  Mental Status: Normal mood and affect. Normal behavior. Normal judgment and thought content.   Assessment and Plan:  Pregnancy: [id number] at [redacted]w[redacted]d 1. [redacted] weeks gestation of pregnancy - Culture, beta strep (group b only) - GC/Chlamydia probe amp ([place])not at [facility]  2. Supervision of other high risk pregnancy, antepartum FHT and FH normal  3. Acquired hypothyroidism On levothyroxine  4. Sickle cell  trait ([place])  Term labor symptoms and general obstetric precautions including but not limited to vaginal bleeding, contractions, leaking of fluid and fetal movement were reviewed in detail with the patient. Please refer to After Visit Summary for other counseling recommendations.   No follow-ups on file.  Future Appointments  Date Time Provider [place]  03/26/2022 10:15 AM Truett Mainland, DO [facility] None  04/02/2022 10:15 AM Truett Mainland, DO [facility] None  04/09/2022 10:15 AM Truett Mainland, DO [facility] None    Truett Mainland, DO

## 2022-03-19 NOTE — MAU Note (Signed)
When RN took support person in pt's room pt was standing in the room throwing up on the floor and urinated when she threw up. Helped to clean up and into bed

## 2022-03-19 NOTE — Progress Notes (Signed)
Written and verbal d/c instructions given by Donzetta Sprung RN and pt voiced understanding

## 2022-03-19 NOTE — Progress Notes (Addendum)
OK to d/c EFM per [name] CNM. Pt states dizziness is some better. Pt ambulated to the BR without difficulty and u/a obtained.

## 2022-03-19 NOTE — MAU Note (Addendum)
.  Victoria Miller is a 27 y.o. at [redacted]w[redacted]d here in MAU reporting n/v and dizziness. Got up to BR about 0300 and started throwing up. Then became very dizzy with the room spinning. Dizziness continues. Very nauseated. Denies LOF or VB. Reports good Fm. Some abdominal cramping.   Onset of complaint: 0300 Pain score: 2 Vitals:   03/19/22 0444  BP: 122/75  Pulse: 83  Resp: 17  Temp: 98.5 F (36.9 C)  SpO2: 100%     FHT:138 Lab orders placed from triage:  u/a

## 2022-03-20 LAB — GC/CHLAMYDIA PROBE AMP (~~LOC~~) NOT AT ARMC
Chlamydia: NEGATIVE
Comment: NEGATIVE
Comment: NORMAL
Neisseria Gonorrhea: NEGATIVE

## 2022-03-23 LAB — CULTURE, BETA STREP (GROUP B ONLY): Strep Gp B Culture: NEGATIVE

## 2022-03-26 ENCOUNTER — Ambulatory Visit (INDEPENDENT_AMBULATORY_CARE_PROVIDER_SITE_OTHER): Payer: Medicaid Other | Admitting: Family Medicine

## 2022-03-26 VITALS — BP 128/76 | HR 97 | Wt 197.0 lb

## 2022-03-26 DIAGNOSIS — E039 Hypothyroidism, unspecified: Secondary | ICD-10-CM

## 2022-03-26 DIAGNOSIS — O09893 Supervision of other high risk pregnancies, third trimester: Secondary | ICD-10-CM

## 2022-03-26 DIAGNOSIS — O09899 Supervision of other high risk pregnancies, unspecified trimester: Secondary | ICD-10-CM

## 2022-03-26 DIAGNOSIS — D573 Sickle-cell trait: Secondary | ICD-10-CM

## 2022-03-26 DIAGNOSIS — Z3A37 37 weeks gestation of pregnancy: Secondary | ICD-10-CM

## 2022-03-26 NOTE — Progress Notes (Signed)
   PRENATAL VISIT NOTE  Subjective:  Victoria Miller is a 27 y.o. [id number] at [redacted]w[redacted]d being seen today for ongoing prenatal care.  She is currently monitored for the following issues for this high-risk pregnancy and has Sickle cell trait ([place]); Hypothyroidism; Bacterial vaginosis; Abnormal hemoglobin ([place]); Missed abortion with fetal demise before 42 completed weeks of gestation; and Supervision of other high risk pregnancy, antepartum on their problem list.  Patient reports occasional contractions.  Contractions: Irritability ("crampy"). Vag. Bleeding: None.  Movement: Present. Denies leaking of fluid.   The following portions of the patient's history were reviewed and updated as appropriate: allergies, current medications, past family history, past medical history, past social history, past surgical history and problem list.   Objective:   Vitals:   03/26/22 1019  BP: 128/76  Pulse: 97  Weight: 197 lb (89.4 kg)    Fetal Status: Fetal Heart Rate (bpm): 140 Fundal Height: 37 cm Movement: Present  Presentation: Vertex  General:  Alert, oriented and cooperative. Patient is in no acute distress.  Skin: Skin is warm and dry. No rash noted.   Cardiovascular: Normal heart rate noted  Respiratory: Normal respiratory effort, no problems with respiration noted  Abdomen: Soft, gravid, appropriate for gestational age.  Pain/Pressure: Present     Pelvic: Cervical exam performed in the presence of a chaperone Dilation: 1.5 Effacement (%): 50 Station: -3  Extremities: Normal range of motion.  Edema: None  Mental Status: Normal mood and affect. Normal behavior. Normal judgment and thought content.   Assessment and Plan:  Pregnancy: [id number] at [redacted]w[redacted]d 1. Supervision of other high risk pregnancy, antepartum  2. [redacted] weeks gestation of pregnancy  3. Acquired hypothyroidism On levothyroxine  4. Sickle cell trait ([place])  Term labor symptoms and general obstetric precautions including but not limited to  vaginal bleeding, contractions, leaking of fluid and fetal movement were reviewed in detail with the patient. Please refer to After Visit Summary for other counseling recommendations.   No follow-ups on file.  Future Appointments  Date Time Provider [place]  04/02/2022 10:15 AM Truett Mainland, DO [facility] None  04/09/2022 10:15 AM Truett Mainland, DO [facility] None    Truett Mainland, DO

## 2022-04-02 ENCOUNTER — Ambulatory Visit (INDEPENDENT_AMBULATORY_CARE_PROVIDER_SITE_OTHER): Payer: Medicaid Other | Admitting: Family Medicine

## 2022-04-02 VITALS — BP 123/76 | HR 69 | Wt 196.0 lb

## 2022-04-02 DIAGNOSIS — D582 Other hemoglobinopathies: Secondary | ICD-10-CM

## 2022-04-02 DIAGNOSIS — E039 Hypothyroidism, unspecified: Secondary | ICD-10-CM

## 2022-04-02 DIAGNOSIS — O99013 Anemia complicating pregnancy, third trimester: Secondary | ICD-10-CM

## 2022-04-02 DIAGNOSIS — D573 Sickle-cell trait: Secondary | ICD-10-CM

## 2022-04-02 DIAGNOSIS — O09899 Supervision of other high risk pregnancies, unspecified trimester: Secondary | ICD-10-CM

## 2022-04-02 DIAGNOSIS — O99283 Endocrine, nutritional and metabolic diseases complicating pregnancy, third trimester: Secondary | ICD-10-CM

## 2022-04-02 DIAGNOSIS — Z3A38 38 weeks gestation of pregnancy: Secondary | ICD-10-CM

## 2022-04-02 NOTE — Progress Notes (Signed)
   PRENATAL VISIT NOTE  Subjective:  Victoria Miller is a 27 y.o. [id number] at [redacted]w[redacted]d being seen today for ongoing prenatal care.  She is currently monitored for the following issues for this high-risk pregnancy and has Sickle cell trait ([place]); Hypothyroidism; Bacterial vaginosis; Abnormal hemoglobin ([place]); Missed abortion with fetal demise before 70 completed weeks of gestation; and Supervision of other high risk pregnancy, antepartum on their problem list.  Patient reports occasional contractions.  Contractions: Irritability. Vag. Bleeding: None.  Movement: Present. Denies leaking of fluid.   The following portions of the patient's history were reviewed and updated as appropriate: allergies, current medications, past family history, past medical history, past social history, past surgical history and problem list.   Objective:   Vitals:   04/02/22 1020  BP: 123/76  Pulse: 69  Weight: 196 lb (88.9 kg)    Fetal Status: Fetal Heart Rate (bpm): 143 Fundal Height: 38 cm Movement: Present  Presentation: Vertex  General:  Alert, oriented and cooperative. Patient is in no acute distress.  Skin: Skin is warm and dry. No rash noted.   Cardiovascular: Normal heart rate noted  Respiratory: Normal respiratory effort, no problems with respiration noted  Abdomen: Soft, gravid, appropriate for gestational age.  Pain/Pressure: Present     Pelvic: Cervical exam performed in the presence of a chaperone Dilation: 3 Effacement (%): 50 Station: -3  Extremities: Normal range of motion.  Edema: Trace  Mental Status: Normal mood and affect. Normal behavior. Normal judgment and thought content.   Assessment and Plan:  Pregnancy: [id number] at [redacted]w[redacted]d 1. Supervision of other high risk pregnancy, antepartum FHT and FH normal Desires elective induction of labor - will place patient on list for 39 weeks. Discussed that can get called in at any point after that.  2. Acquired hypothyroidism On levothyroxine  3.  Sickle cell trait ([place])  4. Abnormal hemoglobin ([facility])  Term labor symptoms and general obstetric precautions including but not limited to vaginal bleeding, contractions, leaking of fluid and fetal movement were reviewed in detail with the patient. Please refer to After Visit Summary for other counseling recommendations.   No follow-ups on file.  Future Appointments  Date Time Provider [place]  04/09/2022 10:15 AM Truett Mainland, DO [facility] None    Truett Mainland, DO

## 2022-04-03 ENCOUNTER — Inpatient Hospital Stay (HOSPITAL_COMMUNITY)
Admission: AD | Admit: 2022-04-03 | Discharge: 2022-04-03 | Disposition: A | Discharge status: Home / Self Care | Payer: Medicaid Other | Attending: Family Medicine | Admitting: Family Medicine

## 2022-04-03 ENCOUNTER — Inpatient Hospital Stay (HOSPITAL_BASED_OUTPATIENT_CLINIC_OR_DEPARTMENT_OTHER): Payer: Medicaid Other

## 2022-04-03 ENCOUNTER — Encounter (HOSPITAL_COMMUNITY): Payer: Self-pay | Admitting: Family Medicine

## 2022-04-03 DIAGNOSIS — D573 Sickle-cell trait: Secondary | ICD-10-CM | POA: Diagnosis not present

## 2022-04-03 DIAGNOSIS — Z3A38 38 weeks gestation of pregnancy: Secondary | ICD-10-CM

## 2022-04-03 DIAGNOSIS — O368130 Decreased fetal movements, third trimester, not applicable or unspecified: Secondary | ICD-10-CM | POA: Diagnosis present

## 2022-04-03 DIAGNOSIS — O418X30 Other specified disorders of amniotic fluid and membranes, third trimester, not applicable or unspecified: Secondary | ICD-10-CM

## 2022-04-03 DIAGNOSIS — O09293 Supervision of pregnancy with other poor reproductive or obstetric history, third trimester: Secondary | ICD-10-CM | POA: Diagnosis not present

## 2022-04-03 DIAGNOSIS — Z0371 Encounter for suspected problem with amniotic cavity and membrane ruled out: Secondary | ICD-10-CM | POA: Diagnosis not present

## 2022-04-03 DIAGNOSIS — O471 False labor at or after 37 completed weeks of gestation: Secondary | ICD-10-CM | POA: Insufficient documentation

## 2022-04-03 DIAGNOSIS — E039 Hypothyroidism, unspecified: Secondary | ICD-10-CM

## 2022-04-03 DIAGNOSIS — Z3689 Encounter for other specified antenatal screening: Secondary | ICD-10-CM

## 2022-04-03 DIAGNOSIS — O99283 Endocrine, nutritional and metabolic diseases complicating pregnancy, third trimester: Secondary | ICD-10-CM

## 2022-04-03 NOTE — Discharge Instructions (Signed)
2/3-1-1 Rule ?Go to MAU for painful contractions every 2-3 minutes, lasting 1 minute each for 1.5 hours. ? ?

## 2022-04-03 NOTE — MAU Note (Signed)
Pt reporting Ucs have become stronger while in ultrasound-states also that baby is moving external monitors reapplied-SVE unchanged from office visit on Thursday. Reviewed labor precautions and FM with Pt.

## 2022-04-03 NOTE — MAU Provider Note (Signed)
History     CSN: [id number]  Arrival date and time: 04/03/22 2022   Event Date/Time   First Provider Initiated Contact with Patient 04/03/22 2113      Chief Complaint  Patient presents with   Decreased Fetal Movement   [name] is a 27 y.o. year old [id number] female at [redacted]w[redacted]d weeks gestation who presents to MAU reporting leaking clear fluid sporadically since her membranes were swept yesterday; no leaking now. She also reports DFM since 1300 today. She states she is concerned about the baby's fluid level since it is not moving that much. She reports the last pregnancy she had to be induced for low fluid level. When she mentioned this to Dr. Nehemiah Settle yesterday, "he didn't seem concerned about it." Her cervix was 3 cm/50%/-3 yesterday. She receives [facility] with [facility]; next appt is 04/09/2022. She has a scheduled IOL on 04/18/2022.    OB History     Gravida  5   Para  1   Term  1   Preterm      AB  3   Living  1      SAB  2   IAB  1   Ectopic      Multiple      Live Births  1           Past Medical History:  Diagnosis Date   History of blood transfusion    d/t miscarriage   History of ovarian cyst    Hypothyroidism    followed by pcp   Ruptured ovarian cyst 01/08/2018   Sickle cell trait [facility])     Past Surgical History:  Procedure Laterality Date   DILATION AND CURETTAGE OF UTERUS     July 2022   DILATION AND EVACUATION N/A 05/07/2021   Procedure: DILATATION AND EVACUATION, PARACERVICAL BLOCK;  Surgeon: Lavonia Drafts, MD;  Location: [place];  Service: Gynecology;  Laterality: N/A;    Family History  Problem Relation Age of Onset   Heart disease Mother    Arthritis Mother     Social History   Tobacco Use   Smoking status: Never   Smokeless tobacco: Never  Vaping Use   Vaping Use: Never used  Substance Use Topics   Alcohol use: Not Currently   Drug use: Never    Allergies: No Known Allergies  Medications Prior to Admission   Medication Sig Dispense Refill Last Dose   acetaminophen (TYLENOL) 500 MG tablet Take 500 mg by mouth every 6 (six) hours as needed.   Past Month   famotidine (PEPCID) 20 MG tablet Take 1 tablet (20 mg total) by mouth at bedtime. 30 tablet 6 04/02/2022   ferrous sulfate 325 (65 FE) MG EC tablet Take 325 mg by mouth daily with breakfast. Patient is taking 1 tablet every other day.   04/03/2022   levothyroxine (SYNTHROID) 200 MCG tablet Take 1 tablet (200 mcg total) by mouth daily before breakfast. 90 tablet 1 04/03/2022   levothyroxine (SYNTHROID) 50 MCG tablet Take 1 tablet (50 mcg total) by mouth daily before breakfast. 60 tablet 1 04/03/2022   magnesium oxide (MAG-OX) 400 (240 Mg) MG tablet Take 250 mg by mouth daily.   Past Month   Prenat-FeAsp-Meth-FA-DHA w/o A (PRENATE PIXIE) 10-0.6-0.4-200 MG CAPS Take 1 capsule by mouth daily. 9 capsule 0 Past Week   bisacodyl 5 MG EC tablet Take 1 tablet (5 mg total) by mouth daily as needed for moderate constipation. 30 tablet 3  meclizine (ANTIVERT) 25 MG tablet Take 1 tablet (25 mg total) by mouth 3 (three) times daily as needed for dizziness. 30 tablet 0    ondansetron (ZOFRAN-ODT) 4 MG disintegrating tablet Take 1 tablet (4 mg total) by mouth every 8 (eight) hours as needed for nausea or vomiting. (Patient not taking: Reported on 03/26/2022) 20 tablet 0    promethazine (PHENERGAN) 25 MG tablet Take 1 tablet (25 mg total) by mouth every 6 (six) hours as needed for nausea or vomiting. (Patient not taking: Reported on 03/26/2022) 30 tablet 2     Review of Systems  Constitutional: Negative.   HENT: Negative.    Eyes: Negative.   Respiratory: Negative.    Cardiovascular: Negative.   Gastrointestinal: Negative.   Endocrine: Negative.   Genitourinary:  Positive for vaginal discharge (leaked clear fluid after membranes were swept yesterday and then one time today while exercising).       DFM since 1300 today. "I tired poking him while I was in the lobby and  he still didn't move."  Musculoskeletal: Negative.   Skin: Negative.   Allergic/Immunologic: Negative.   Neurological: Negative.   Hematological: Negative.   Psychiatric/Behavioral: Negative.     Physical Exam   Temperature 97.7 F (36.5 C), temperature source Oral, resp. rate 16, last menstrual period 07/05/2021, SpO2 99 %, unknown if currently breastfeeding.  Physical Exam Vitals and nursing note reviewed.  Constitutional:      Appearance: Normal appearance. She is normal weight.  Pulmonary:     Effort: Pulmonary effort is normal.  Abdominal:     Palpations: Abdomen is soft.  Genitourinary:    Comments: Deferred -- AFI ordered Musculoskeletal:        General: Normal range of motion.  Skin:    General: Skin is warm and dry.  Neurological:     Mental Status: She is alert and oriented to person, place, and time.  Psychiatric:        Mood and Affect: Mood normal.        Behavior: Behavior normal.        Thought Content: Thought content normal.        Judgment: Judgment normal.    REACTIVE NST - FHR: 130 bpm / moderate variability / accels present / decels absent / TOCO: irregular UC's -- not felt by patient  Reassessment @ 2215: Patient returned from U/S complaining of more UCs. Orders given for RN to check cervix. Per RN note, patient's cervix is unchanged from yesterday's exam.  MAU Course  Procedures  MDM CCUA OB MFM Limited U/S      Assessment and Plan  1. No leakage of amniotic fluid into vagina - Reassurance given that fluid level is normal - Discussed the possibility of leaking urine with baby's head applying increased pressure to bladder and with exercising  2. NST (non-stress test) reactive - Reassurance given of Reactive NST - Discussed dampened or fewer movements felt with anterior placenta - Information provided on [place]   3. False labor after 37 completed weeks of gestation - Information provided on signs of labor - 2/3-1-1 Rule: Go to MAU for  painful contractions every 2-3 minutes, lasting 1 minute each for 1.5 hours.   4. [redacted] weeks gestation of pregnancy   - Discharge patient - Keep scheduled appt with [facility] on 04/09/2022 - Patient verbalized an understanding of the plan of care and agrees.   [name], CNM 04/03/2022, 9:14 PM

## 2022-04-03 NOTE — MAU Note (Signed)
..  Victoria Miller is a 27 y.o. at [redacted]w[redacted]d here in MAU reporting: leaking of fluid aftyer her membrane sweep on Wednesday. Reports that it is clear and sporadic. Not leaking currently. Reports decreased fetal movement. Last movement was 1pm. Occasional contractions.   [id number] in room  Lab orders placed from triage:  [place] labor

## 2022-04-06 ENCOUNTER — Encounter (HOSPITAL_COMMUNITY): Payer: Self-pay | Admitting: Family Medicine

## 2022-04-06 ENCOUNTER — Inpatient Hospital Stay (HOSPITAL_COMMUNITY)
Admission: AD | Admit: 2022-04-06 | Discharge: 2022-04-09 | DRG: 807 | Disposition: A | Discharge status: Home / Self Care | Payer: Medicaid Other | Attending: Family Medicine | Admitting: Family Medicine

## 2022-04-06 DIAGNOSIS — Z3490 Encounter for supervision of normal pregnancy, unspecified, unspecified trimester: Secondary | ICD-10-CM | POA: Diagnosis present

## 2022-04-06 DIAGNOSIS — O9902 Anemia complicating childbirth: Principal | ICD-10-CM | POA: Diagnosis present

## 2022-04-06 DIAGNOSIS — Z3A39 39 weeks gestation of pregnancy: Secondary | ICD-10-CM

## 2022-04-06 DIAGNOSIS — O99284 Endocrine, nutritional and metabolic diseases complicating childbirth: Secondary | ICD-10-CM | POA: Diagnosis present

## 2022-04-06 DIAGNOSIS — D573 Sickle-cell trait: Secondary | ICD-10-CM | POA: Diagnosis present

## 2022-04-06 DIAGNOSIS — E039 Hypothyroidism, unspecified: Secondary | ICD-10-CM | POA: Diagnosis present

## 2022-04-06 DIAGNOSIS — O09899 Supervision of other high risk pregnancies, unspecified trimester: Secondary | ICD-10-CM

## 2022-04-06 DIAGNOSIS — Z79890 Hormone replacement therapy (postmenopausal): Secondary | ICD-10-CM

## 2022-04-06 NOTE — MAU Note (Signed)
.  Victoria Miller is a 27 y.o. at [redacted]w[redacted]d here in MAU reporting: ctx since 7pm . Good fetal movement and some bloody show.  3 cm on wed and had membranes swept.  LMP:  Onset of complaint: 7pm Pain score: 10 Vitals:   04/06/22 2302  BP: 133/79  Pulse: 98  Resp: 18  Temp: 98.7 F (37.1 C)     FHT:139 Lab orders placed from triage:  la or eval

## 2022-04-06 NOTE — MAU Note (Signed)
Pt denies HA, visual changes or epigastric pain. No swelling or edema. Provider in delivery - MAU provider updated. Urine sent.

## 2022-04-07 ENCOUNTER — Encounter (HOSPITAL_COMMUNITY): Payer: Self-pay | Admitting: Family Medicine

## 2022-04-07 ENCOUNTER — Inpatient Hospital Stay (HOSPITAL_COMMUNITY): Payer: Medicaid Other | Admitting: Anesthesiology

## 2022-04-07 ENCOUNTER — Other Ambulatory Visit: Payer: Self-pay

## 2022-04-07 DIAGNOSIS — O99284 Endocrine, nutritional and metabolic diseases complicating childbirth: Secondary | ICD-10-CM | POA: Diagnosis not present

## 2022-04-07 DIAGNOSIS — O26893 Other specified pregnancy related conditions, third trimester: Secondary | ICD-10-CM | POA: Diagnosis not present

## 2022-04-07 DIAGNOSIS — E039 Hypothyroidism, unspecified: Secondary | ICD-10-CM | POA: Diagnosis not present

## 2022-04-07 DIAGNOSIS — D573 Sickle-cell trait: Secondary | ICD-10-CM | POA: Diagnosis not present

## 2022-04-07 DIAGNOSIS — O9902 Anemia complicating childbirth: Secondary | ICD-10-CM | POA: Diagnosis not present

## 2022-04-07 DIAGNOSIS — Z79890 Hormone replacement therapy (postmenopausal): Secondary | ICD-10-CM | POA: Diagnosis not present

## 2022-04-07 DIAGNOSIS — Z3A39 39 weeks gestation of pregnancy: Secondary | ICD-10-CM | POA: Diagnosis not present

## 2022-04-07 DIAGNOSIS — Z3490 Encounter for supervision of normal pregnancy, unspecified, unspecified trimester: Secondary | ICD-10-CM | POA: Diagnosis present

## 2022-04-07 LAB — CBC
HCT: 34.7 % — ABNORMAL LOW (ref 36.0–46.0)
Hemoglobin: 11.7 g/dL — ABNORMAL LOW (ref 12.0–15.0)
MCH: 30.3 pg (ref 26.0–34.0)
MCHC: 33.7 g/dL (ref 30.0–36.0)
MCV: 89.9 fL (ref 80.0–100.0)
Platelets: 183 10*3/uL (ref 150–400)
RBC: 3.86 MIL/uL — ABNORMAL LOW (ref 3.87–5.11)
RDW: 13.9 % (ref 11.5–15.5)
WBC: 9.1 10*3/uL (ref 4.0–10.5)
nRBC: 0 % (ref 0.0–0.2)

## 2022-04-07 LAB — PROTEIN / CREATININE RATIO, URINE
Creatinine, Urine: 44 mg/dL
Protein Creatinine Ratio: 0.16 mg/mg{Cre} — ABNORMAL HIGH (ref 0.00–0.15)
Total Protein, Urine: 7 mg/dL

## 2022-04-07 LAB — COMPREHENSIVE METABOLIC PANEL
ALT: 17 U/L (ref 0–44)
AST: 27 U/L (ref 15–41)
Albumin: 3 g/dL — ABNORMAL LOW (ref 3.5–5.0)
Alkaline Phosphatase: 164 U/L — ABNORMAL HIGH (ref 38–126)
Anion gap: 9 (ref 5–15)
BUN: <5 mg/dL — ABNORMAL LOW (ref 6–20)
CO2: 21 mmol/L — ABNORMAL LOW (ref 22–32)
Calcium: 9.4 mg/dL (ref 8.9–10.3)
Chloride: 104 mmol/L (ref 98–111)
Creatinine, Ser: 0.55 mg/dL (ref 0.44–1.00)
GFR, Estimated: >60 mL/min (ref >60)
Glucose, Bld: 89 mg/dL (ref 70–99)
Potassium: 3.7 mmol/L (ref 3.5–5.1)
Sodium: 134 mmol/L — ABNORMAL LOW (ref 135–145)
Total Bilirubin: 0.6 mg/dL (ref 0.3–1.2)
Total Protein: 6.9 g/dL (ref 6.5–8.1)

## 2022-04-07 LAB — TYPE AND SCREEN
ABO/RH(D): A POS
Antibody Screen: NEGATIVE

## 2022-04-07 LAB — OB RESULTS CONSOLE RPR: RPR: NONREACTIVE

## 2022-04-07 LAB — OB RESULTS CONSOLE RUBELLA ANTIBODY, IGM: Rubella: IMMUNE

## 2022-04-07 LAB — OB RESULTS CONSOLE HEPATITIS B SURFACE ANTIGEN: Hepatitis B Surface Ag: NEGATIVE

## 2022-04-07 LAB — OB RESULTS CONSOLE HIV ANTIBODY (ROUTINE TESTING): HIV: NONREACTIVE

## 2022-04-07 MED ORDER — TERBUTALINE SULFATE 1 MG/ML IJ SOLN: 0.2500 mg | Freq: Once | INTRAMUSCULAR | Status: DC | PRN

## 2022-04-07 MED ORDER — SODIUM CHLORIDE 0.9% FLUSH: 3.0000 mL | INTRAVENOUS | Status: DC | PRN

## 2022-04-07 MED ORDER — LEVOTHYROXINE SODIUM 50 MCG PO TABS
50.0000 ug | ORAL_TABLET | Freq: Every day | ORAL | Status: DC
  Administered 2022-04-07 – 2022-04-08 (×2): 50 ug via ORAL
  Filled 2022-04-07 (×2): qty 1

## 2022-04-07 MED ORDER — SOD CITRATE-CITRIC ACID 500-334 MG/5ML PO SOLN: 30.0000 mL | ORAL | Status: DC | PRN

## 2022-04-07 MED ORDER — ACETAMINOPHEN 325 MG PO TABS: 650.0000 mg | ORAL_TABLET | ORAL | Status: DC | PRN

## 2022-04-07 MED ORDER — PHENYLEPHRINE 80 MCG/ML (10ML) SYRINGE FOR IV PUSH (FOR BLOOD PRESSURE SUPPORT): 80.0000 ug | PREFILLED_SYRINGE | INTRAVENOUS | Status: DC | PRN

## 2022-04-07 MED ORDER — TETANUS-DIPHTH-ACELL PERTUSSIS 5-2.5-18.5 LF-MCG/0.5 IM SUSY: 0.5000 mL | PREFILLED_SYRINGE | Freq: Once | INTRAMUSCULAR | Status: DC

## 2022-04-07 MED ORDER — OXYCODONE-ACETAMINOPHEN 5-325 MG PO TABS: 1.0000 | ORAL_TABLET | ORAL | Status: DC | PRN

## 2022-04-07 MED ORDER — OXYTOCIN BOLUS FROM INFUSION
333.0000 mL | Freq: Once | INTRAVENOUS | Status: AC
  Administered 2022-04-07: 600 mL/h via INTRAVENOUS

## 2022-04-07 MED ORDER — ONDANSETRON HCL 4 MG PO TABS: 4.0000 mg | ORAL_TABLET | ORAL | Status: DC | PRN

## 2022-04-07 MED ORDER — FENTANYL CITRATE (PF) 100 MCG/2ML IJ SOLN
INTRAMUSCULAR | Status: AC
  Filled 2022-04-07: qty 2

## 2022-04-07 MED ORDER — ONDANSETRON HCL 4 MG/2ML IJ SOLN
4.0000 mg | Freq: Four times a day (QID) | INTRAMUSCULAR | Status: DC | PRN
  Administered 2022-04-07 (×2): 4 mg via INTRAVENOUS
  Filled 2022-04-07 (×2): qty 2

## 2022-04-07 MED ORDER — FENTANYL CITRATE (PF) 100 MCG/2ML IJ SOLN
100.0000 ug | Freq: Once | INTRAMUSCULAR | Status: AC
  Administered 2022-04-07: 100 ug via INTRAVENOUS

## 2022-04-07 MED ORDER — MEASLES, MUMPS & RUBELLA VAC IJ SOLR: 0.5000 mL | Freq: Once | INTRAMUSCULAR | Status: DC

## 2022-04-07 MED ORDER — LIDOCAINE HCL (PF) 1 % IJ SOLN
INTRAMUSCULAR | Status: DC | PRN
  Administered 2022-04-07: 11 mL via EPIDURAL

## 2022-04-07 MED ORDER — EPHEDRINE 5 MG/ML INJ: 10.0000 mg | INTRAVENOUS | Status: DC | PRN

## 2022-04-07 MED ORDER — OXYTOCIN-SODIUM CHLORIDE 30-0.9 UT/500ML-% IV SOLN
2.5000 [IU]/h | INTRAVENOUS | Status: DC
  Administered 2022-04-07 (×2): 2.5 [IU]/h via INTRAVENOUS
  Filled 2022-04-07: qty 500

## 2022-04-07 MED ORDER — OXYTOCIN-SODIUM CHLORIDE 30-0.9 UT/500ML-% IV SOLN
1.0000 m[IU]/min | INTRAVENOUS | Status: DC
  Administered 2022-04-07: 2 m[IU]/min via INTRAVENOUS

## 2022-04-07 MED ORDER — SODIUM CHLORIDE 0.9% FLUSH: 3.0000 mL | Freq: Two times a day (BID) | INTRAVENOUS | Status: DC

## 2022-04-07 MED ORDER — ACETAMINOPHEN 325 MG PO TABS
650.0000 mg | ORAL_TABLET | ORAL | Status: DC | PRN
  Administered 2022-04-07 – 2022-04-09 (×6): 650 mg via ORAL
  Filled 2022-04-07 (×6): qty 2

## 2022-04-07 MED ORDER — LACTATED RINGERS IV SOLN: INTRAVENOUS | Status: DC

## 2022-04-07 MED ORDER — LEVOTHYROXINE SODIUM 100 MCG PO TABS
200.0000 ug | ORAL_TABLET | Freq: Every day | ORAL | Status: DC
  Administered 2022-04-07 – 2022-04-08 (×2): 200 ug via ORAL
  Filled 2022-04-07 (×2): qty 2

## 2022-04-07 MED ORDER — SODIUM CHLORIDE 0.9 % IV SOLN: 250.0000 mL | INTRAVENOUS | Status: DC | PRN

## 2022-04-07 MED ORDER — WITCH HAZEL-GLYCERIN EX PADS: 1.0000 | MEDICATED_PAD | CUTANEOUS | Status: DC | PRN

## 2022-04-07 MED ORDER — SENNOSIDES-DOCUSATE SODIUM 8.6-50 MG PO TABS
2.0000 | ORAL_TABLET | ORAL | Status: DC
  Administered 2022-04-07 – 2022-04-09 (×3): 2 via ORAL
  Filled 2022-04-07 (×3): qty 2

## 2022-04-07 MED ORDER — DIPHENHYDRAMINE HCL 25 MG PO CAPS: 25.0000 mg | ORAL_CAPSULE | Freq: Four times a day (QID) | ORAL | Status: DC | PRN

## 2022-04-07 MED ORDER — ZOLPIDEM TARTRATE 5 MG PO TABS: 5.0000 mg | ORAL_TABLET | Freq: Every evening | ORAL | Status: DC | PRN

## 2022-04-07 MED ORDER — LIDOCAINE HCL (PF) 1 % IJ SOLN: 30.0000 mL | INTRAMUSCULAR | Status: DC | PRN

## 2022-04-07 MED ORDER — ONDANSETRON HCL 4 MG/2ML IJ SOLN: 4.0000 mg | INTRAMUSCULAR | Status: DC | PRN

## 2022-04-07 MED ORDER — OXYCODONE-ACETAMINOPHEN 5-325 MG PO TABS: 2.0000 | ORAL_TABLET | ORAL | Status: DC | PRN

## 2022-04-07 MED ORDER — DIPHENHYDRAMINE HCL 50 MG/ML IJ SOLN: 12.5000 mg | INTRAMUSCULAR | Status: DC | PRN

## 2022-04-07 MED ORDER — COCONUT OIL OIL
1.0000 | TOPICAL_OIL | Status: DC | PRN
  Administered 2022-04-09: 1 via TOPICAL

## 2022-04-07 MED ORDER — IBUPROFEN 600 MG PO TABS
600.0000 mg | ORAL_TABLET | Freq: Four times a day (QID) | ORAL | Status: DC
  Administered 2022-04-07 – 2022-04-09 (×9): 600 mg via ORAL
  Filled 2022-04-07 (×9): qty 1

## 2022-04-07 MED ORDER — LACTATED RINGERS IV SOLN
500.0000 mL | Freq: Once | INTRAVENOUS | Status: AC
  Administered 2022-04-07: 500 mL via INTRAVENOUS

## 2022-04-07 MED ORDER — LACTATED RINGERS IV SOLN: 500.0000 mL | INTRAVENOUS | Status: DC | PRN

## 2022-04-07 MED ORDER — DIBUCAINE (PERIANAL) 1 % EX OINT: 1.0000 | TOPICAL_OINTMENT | CUTANEOUS | Status: DC | PRN

## 2022-04-07 MED ORDER — SIMETHICONE 80 MG PO CHEW: 80.0000 mg | CHEWABLE_TABLET | ORAL | Status: DC | PRN

## 2022-04-07 MED ORDER — PRENATAL MULTIVITAMIN CH
1.0000 | ORAL_TABLET | Freq: Every day | ORAL | Status: DC
  Administered 2022-04-07 – 2022-04-09 (×3): 1 via ORAL
  Filled 2022-04-07 (×3): qty 1

## 2022-04-07 MED ORDER — BENZOCAINE-MENTHOL 20-0.5 % EX AERO
1.0000 | INHALATION_SPRAY | CUTANEOUS | Status: DC | PRN
  Administered 2022-04-07: 1 via TOPICAL
  Filled 2022-04-07: qty 56

## 2022-04-07 MED ORDER — FENTANYL-BUPIVACAINE-NACL 0.5-0.125-0.9 MG/250ML-% EP SOLN
12.0000 mL/h | EPIDURAL | Status: DC | PRN
  Administered 2022-04-07: 12 mL/h via EPIDURAL
  Filled 2022-04-07: qty 250

## 2022-04-07 NOTE — MAU Note (Signed)
Pt would like to walk and then try IV pain medication. Dr.Pratt on unit agreeable with plan

## 2022-04-07 NOTE — Anesthesia Procedure Notes (Signed)
Epidural Patient location during procedure: OB Start time: 04/07/2022 3:50 AM End time: 04/07/2022 4:06 AM  Staffing Anesthesiologist: Lynda Rainwater, MD Performed: anesthesiologist   Preanesthetic Checklist Completed: patient identified, IV checked, site marked, risks and benefits discussed, surgical consent, monitors and equipment checked, pre-op evaluation and timeout performed  Epidural Patient position: sitting Prep: ChloraPrep Patient monitoring: heart rate, cardiac monitor, continuous pulse ox and blood pressure Approach: midline Location: L2-L3 Injection technique: LOR saline  Needle:  Needle type: Tuohy  Needle gauge: 17 G Needle length: 9 cm Needle insertion depth: 5 cm Catheter type: closed end flexible Catheter size: 20 Guage Catheter at skin depth: 9 cm Test dose: negative  Assessment Events: blood not aspirated, injection not painful, no injection resistance, no paresthesia and negative IV test  Additional Notes Reason for block:procedure for pain

## 2022-04-07 NOTE — H&P (Signed)
OBSTETRIC ADMISSION HISTORY AND PHYSICAL  Victoria Miller is a 27 y.o. female [phone] with IUP at [redacted]w[redacted]d by LMP presenting for SOL.   Reports fetal movement. Denies vaginal bleeding.  She received her prenatal care at  [facility] [facility] .  Support person in labor: Statistician U/S: Normal  Prenatal History/Complications: SCT Hypothyroidism Abnormal HgB Q MAB (2023)  OB BOX: Nursing Staff Provider  Office Location [facility]  Dating  LMP  [facility] Model [x]  Traditional [ ]  Centering [ ]  Mom-Baby Dyad    Language  English  Anatomy US  normal  Flu Vaccine  No Genetic/Carrier Screen  NIPS:    AFP: Negative (12/04/2021)  Horizon:  TDaP Vaccine  Declines  Hgb A1C or  GTT Early  Third trimester - neg 1hr  COVID Vaccine    LAB RESULTS   Rhogam   Blood Type A/Positive/-- (08/30 1409)   Baby Feeding Plan Breast and bottle  Antibody Negative (08/30 1409)  Contraception  Rubella 1.68 (08/30 1409) - IMMUNE  Circumcision Yes, if boy  RPR Non Reactive (08/30 1409)   Pediatrician  [facility]  HBsAg Negative (08/30 1409)   Support Person FOB and family  HCVAb NR  Prenatal Classes N/A HIV Non Reactive (08/30 1409)     BTL Consent  GBS  neg  VBAC Consent  Pap        DME Rx [ ]  BP cuff [ ]  Weight Scale Waterbirth  [ ]  Class [ ]  Consent [ ]  CNM visit  PHQ9 & GAD7 [  ] new OB [  ] 28 weeks  [  ] 36 weeks Induction  [x]  Orders Entered [ ] Foley Y/N   Past Medical History: Past Medical History:  Diagnosis Date   History of blood transfusion    d/t miscarriage   History of ovarian cyst    Hypothyroidism    followed by pcp   Ruptured ovarian cyst 01/08/2018   Sickle cell trait     Past Surgical History: Past Surgical History:  Procedure Laterality Date   DILATION AND CURETTAGE OF UTERUS     July 2022   DILATION AND EVACUATION N/A 05/07/2021   Procedure: DILATATION AND EVACUATION, PARACERVICAL BLOCK;  Surgeon: Lavonia Drafts, MD;  Location: [place];  Service: Gynecology;   Laterality: N/A;    Obstetrical History: OB History     Gravida  5   Para  1   Term  1   Preterm      AB  3   Living  1      SAB  2   IAB  1   Ectopic      Multiple      Live Births  1           Social History: Social History   Socioeconomic History   Marital status: Single    Spouse name: Not on file   Number of children: 1   Years of education: Not on file   Highest education level: 12th grade  Occupational History   Not on file  Tobacco Use   Smoking status: Never   Smokeless tobacco: Never  Vaping Use   Vaping Use: Never used  Substance and Sexual Activity   Alcohol use: Not Currently   Drug use: Never   Sexual activity: Yes    Birth control/protection: None    Comment: 1 partner  Other Topics Concern   Not on file  Social History Narrative   Not on file  Social Determinants of Health   Financial Resource Strain: Low Risk  (01/16/2020)   Overall Financial Resource Strain (CARDIA)    Difficulty of Paying Living Expenses: Not hard at all  Food Insecurity: No Food Insecurity (01/16/2020)   Hunger Vital Sign    Worried About Running Out of Food in the Last Year: Never true    Ran Out of Food in the Last Year: Never true  Transportation Needs: No Transportation Needs (01/16/2020)   PRAPARE - Hydrologist (Medical): No    Lack of Transportation (Non-Medical): No  Physical Activity: Not on file  Stress: No Stress Concern Present (01/16/2020)   [place]    Feeling of Stress : Only a little  Social Connections: Not on file    Family History: Family History  Problem Relation Age of Onset   Heart disease Mother    Arthritis Mother     Allergies: No Known Allergies  Medications Prior to Admission  Medication Sig Dispense Refill Last Dose   levothyroxine (SYNTHROID) 200 MCG tablet Take 1 tablet (200 mcg total) by mouth daily before  breakfast. 90 tablet 1 04/06/2022   levothyroxine (SYNTHROID) 50 MCG tablet Take 1 tablet (50 mcg total) by mouth daily before breakfast. 60 tablet 1 04/06/2022   Prenat-FeAsp-Meth-FA-DHA w/o A (PRENATE PIXIE) 10-0.6-0.4-200 MG CAPS Take 1 capsule by mouth daily. 9 capsule 0 04/06/2022   acetaminophen (TYLENOL) 500 MG tablet Take 500 mg by mouth every 6 (six) hours as needed.      bisacodyl 5 MG EC tablet Take 1 tablet (5 mg total) by mouth daily as needed for moderate constipation. 30 tablet 3    famotidine (PEPCID) 20 MG tablet Take 1 tablet (20 mg total) by mouth at bedtime. 30 tablet 6    ferrous sulfate 325 (65 FE) MG EC tablet Take 325 mg by mouth daily with breakfast. Patient is taking 1 tablet every other day.      magnesium oxide (MAG-OX) 400 (240 Mg) MG tablet Take 250 mg by mouth daily.      meclizine (ANTIVERT) 25 MG tablet Take 1 tablet (25 mg total) by mouth 3 (three) times daily as needed for dizziness. 30 tablet 0    ondansetron (ZOFRAN-ODT) 4 MG disintegrating tablet Take 1 tablet (4 mg total) by mouth every 8 (eight) hours as needed for nausea or vomiting. (Patient not taking: Reported on 03/26/2022) 20 tablet 0    promethazine (PHENERGAN) 25 MG tablet Take 1 tablet (25 mg total) by mouth every 6 (six) hours as needed for nausea or vomiting. (Patient not taking: Reported on 03/26/2022) 30 tablet 2      Review of Systems  All systems reviewed and negative except as stated in HPI  Blood pressure 139/85, pulse 73, temperature 98.7 F (37.1 C), resp. rate 17, height 5\' 11"  (1.803 m), weight 90.7 kg, last menstrual period 07/05/2021, unknown if currently breastfeeding. General appearance: alert, cooperative, and mild distress Lungs: no respiratory distress Heart: regular rate  Abdomen: soft, non-tender; gravid Pelvic: adequate Extremities: Homans sign is negative, no sign of DVT Presentation: cephalic Fetal monitoring: Baseline rate 120 bpm   Variability moderate  Accelerations  present   Decelerations none Uterine activity: regular every 2-3 mins  Dilation: 4.5 Effacement (%): 80 Station: -2 Exam by:: [name] CNM BBOW  Prenatal labs: ABO, Rh: A/Positive/-- (08/30 1409) Antibody: Negative (08/30 1409) Rubella: 1.68 (08/30 1409) - IMMUNE RPR: Non Reactive (  12/28 1047)  HBsAg: Negative (08/30 1409)  HIV: Non Reactive (12/28 1047)  GBS: Negative/-- (03/13 1135)  Glucola: Normal 1 hr Genetic screening:  Normal AFP  Prenatal Transfer Tool  Maternal Diabetes: No Genetic Screening: Normal Maternal Ultrasounds/Referrals: Normal Fetal Ultrasounds or other Referrals:  None Maternal Substance Abuse:  No Significant Maternal Medications:  Meds include: Other: Synthroid Significant Maternal Lab Results: Group B Strep negative and Other: elevated TSH on 02/26/2022  Results for orders placed or performed during the hospital encounter of 04/06/22 (from the past 24 hour(s))  Protein / creatinine ratio, urine   Collection Time: 04/06/22 11:35 PM  Result Value Ref Range   Creatinine, Urine 44 mg/dL   Total Protein, Urine 7 mg/dL   Protein Creatinine Ratio 0.16 (H) 0.00 - 0.15 mg/mg[Cre]  CBC   Collection Time: 04/06/22 11:52 PM  Result Value Ref Range   WBC 9.1 4.0 - 10.5 K/uL   RBC 3.86 (L) 3.87 - 5.11 MIL/uL   Hemoglobin 11.7 (L) 12.0 - 15.0 g/dL   HCT 34.7 (L) 36.0 - 46.0 %   MCV 89.9 80.0 - 100.0 fL   MCH 30.3 26.0 - 34.0 pg   MCHC 33.7 30.0 - 36.0 g/dL   RDW 13.9 11.5 - 15.5 %   Platelets 183 150 - 400 K/uL   nRBC 0.0 0.0 - 0.2 %  Comprehensive metabolic panel   Collection Time: 04/06/22 11:52 PM  Result Value Ref Range   Sodium 134 (L) 135 - 145 mmol/L   Potassium 3.7 3.5 - 5.1 mmol/L   Chloride 104 98 - 111 mmol/L   CO2 21 (L) 22 - 32 mmol/L   Glucose, Bld 89 70 - 99 mg/dL   BUN <5 (L) 6 - 20 mg/dL   Creatinine, Ser 0.55 0.44 - 1.00 mg/dL   Calcium 9.4 8.9 - 10.3 mg/dL   Total Protein 6.9 6.5 - 8.1 g/dL   Albumin 3.0 (L) 3.5 - 5.0 g/dL    AST 27 15 - 41 U/L   ALT 17 0 - 44 U/L   Alkaline Phosphatase 164 (H) 38 - 126 U/L   Total Bilirubin 0.6 0.3 - 1.2 mg/dL   GFR, Estimated >60 >60 mL/min   Anion gap 9 5 - 15    Patient Active Problem List   Diagnosis Date Noted   Encounter for induction of labor 04/07/2022   Supervision of other high risk pregnancy, antepartum 09/04/2021   Abnormal hemoglobin 07/04/2020   Sickle cell trait 01/08/2018   Hypothyroidism 01/08/2018   Bacterial vaginosis 01/08/2018    Assessment/Plan:  Victoria Miller is a 27 y.o. G5P1031 at [redacted]w[redacted]d here for SOL  Labor: active -- pain control: planning epidural  Fetal Wellbeing: EFW 7.5 lbs by Leopold's. Cephalic by SVE.  -- GBS (Negative) -- continuous fetal monitoring - category 1   Postpartum Planning -- breast/bottle -- ?IUD for contraception    [name], [place]  04/07/2022, 12:42 AM

## 2022-04-07 NOTE — Progress Notes (Signed)
Victoria Miller is a 27 y.o. [id number] at [redacted]w[redacted]d admitted for active labor  Subjective: S/P epidural. Comfortable and requesting AROM. N/V with contractions.  Objective: BP (!) 150/82   Pulse 86   Temp 98.7 F (37.1 C) (Oral)   Resp 17   Ht 5\' 11"  (1.803 m)   Wt 90.7 kg   LMP 07/05/2021 (Approximate)   BMI 27.89 kg/m  No intake/output data recorded. No intake/output data recorded.  FHT:  FHR: 125 bpm, variability: moderate,  accelerations:  Present,  decelerations:  Absent UC:   regular, every 2-3 minutes SVE:   Dilation: 6 Effacement (%): 100 Station: -1 Exam by:: [name], CNM  AROM Procedure: Verbal consent to proceed with AROM after discussion of r/b and active management of labor. Copious amount of pink tinged, clear fluid with tiny blood clots in return. Patient verbalized an understanding of the plan of care and agrees. Patient tolerated the procedure well.  Labs: Lab Results  Component Value Date   WBC 9.1 04/06/2022   HGB 11.7 (L) 04/06/2022   HCT 34.7 (L) 04/06/2022   MCV 89.9 04/06/2022   PLT 183 04/06/2022    Assessment / Plan: Spontaneous labor, progressing normally  Labor: Progressing normally Preeclampsia:   n/a Fetal Wellbeing:  Category I Pain Control:  Epidural I/D:  n/a Anticipated MOD:  NSVD  [name], CNM 04/07/2022, 5:28 AM

## 2022-04-07 NOTE — Progress Notes (Signed)
Victoria Miller is a 27 y.o. [id number] at [redacted]w[redacted]d admitted for active labor  Subjective: Comfortable with epidural in LT lateral position. Reports some pressure with contractions.  Objective: BP 128/83   Pulse 74   Temp 98.7 F (37.1 C) (Oral)   Resp 17   Ht 5\' 11"  (1.803 m)   Wt 90.7 kg   LMP 07/05/2021 (Approximate)   BMI 27.89 kg/m  No intake/output data recorded. Total I/O In: -  Out: 250 [Urine:250]  FHT:  FHR: 125 bpm, variability: moderate,  accelerations:  Abscent,  decelerations:  Present early variables UC:   regular, every 2-3 minutes SVE:   Dilation: 7 Effacement (%): 100 Station: -2 Exam by:: Victoria Duel, RN  Labs: Lab Results  Component Value Date   WBC 9.1 04/06/2022   HGB 11.7 (L) 04/06/2022   HCT 34.7 (L) 04/06/2022   MCV 89.9 04/06/2022   PLT 183 04/06/2022    Assessment / Plan: Spontaneous labor, progressing normally  Labor: Progressing normally on Pitocin Preeclampsia:   n/a Fetal Wellbeing:  Category I Pain Control:  Epidural I/D:  n/a Anticipated MOD:  NSVD  [name], CNM 04/07/2022, 7:30 AM

## 2022-04-07 NOTE — Anesthesia Postprocedure Evaluation (Signed)
Anesthesia Post Note  Patient: Nurse, adult  Procedure(s) Performed: AN AD [place]     Patient location during evaluation: Mother Baby Anesthesia Type: Epidural Level of consciousness: awake and alert Pain management: pain level controlled Vital Signs Assessment: post-procedure vital signs reviewed and stable Respiratory status: spontaneous breathing, nonlabored ventilation and respiratory function stable Cardiovascular status: stable Postop Assessment: no headache, no backache and epidural receding Anesthetic complications: no   No notable events documented.  Last Vitals:  Vitals:   04/07/22 1046 04/07/22 1224  BP: 131/76 126/85  Pulse: 89 80  Resp:  18  Temp: 36.5 C 36.4 C  SpO2: 100% 100%    Last Pain:  Vitals:   04/07/22 1355  TempSrc:   PainSc: 0-No pain   Pain Goal:                   Victoria Miller

## 2022-04-07 NOTE — Anesthesia Preprocedure Evaluation (Signed)
Anesthesia Evaluation  Patient identified by MRN, date of birth, ID band Patient awake    Reviewed: Allergy & Precautions, NPO status , Patient's Chart, lab work & pertinent test results  Airway Mallampati: II  TM Distance: >3 FB     Dental   Pulmonary neg pulmonary ROS   breath sounds clear to auscultation       Cardiovascular negative cardio ROS  Rhythm:Regular Rate:Normal     Neuro/Psych negative neurological ROS     GI/Hepatic negative GI ROS, Neg liver ROS,,,  Endo/Other  Hypothyroidism    Renal/GU negative Renal ROS     Musculoskeletal   Abdominal   Peds  Hematology   Anesthesia Other Findings   Reproductive/Obstetrics (+) Pregnancy                             Anesthesia Physical Anesthesia Plan  ASA: 2  Anesthesia Plan: Epidural   Post-op Pain Management:    Induction:   PONV Risk Score and Plan:   Airway Management Planned: Natural Airway  Additional Equipment:   Intra-op Plan:   Post-operative Plan:   Informed Consent: I have reviewed the patients History and Physical, chart, labs and discussed the procedure including the risks, benefits and alternatives for the proposed anesthesia with the patient or authorized representative who has indicated his/her understanding and acceptance.       Plan Discussed with:   Anesthesia Plan Comments:         Anesthesia Quick Evaluation

## 2022-04-07 NOTE — Discharge Summary (Signed)
Postpartum Discharge Summary  Date of Service updated***     Patient Name: Victoria Miller DOB: May 18, 1995 MRN: [id number]  Date of admission: 04/06/2022 Delivery date:04/07/2022  Delivering provider: Deloris Ping  Date of discharge: 04/07/2022  Admitting diagnosis: Encounter for induction of labor [Z34.90] Intrauterine pregnancy: [redacted]w[redacted]d     Secondary diagnosis:  Principal Problem:   Vaginal delivery Active Problems:   Hypothyroidism   Supervision of other high risk pregnancy, antepartum   Encounter for induction of labor  Additional problems: ***    Discharge diagnosis: Term Pregnancy Delivered                                              Post partum procedures:{Postpartum procedures:23558} Augmentation: AROM and Pitocin Complications: None  Hospital course: Onset of Labor With Vaginal Delivery      27 y.o. yo [id number] at [redacted]w[redacted]d was admitted in Latent Labor on 04/06/2022. Labor course was complicated by none.  Membrane Rupture Time/Date: 4:49 AM ,04/07/2022   Delivery Method:  Episiotomy:   Lacerations:    Patient had a postpartum course complicated by ***.  She is ambulating, tolerating a regular diet, passing flatus, and urinating well. Patient is discharged home in stable condition on 04/07/22.  Newborn Data: Birth date:04/07/2022  Birth time:8:49 AM  Gender:Female  Living status:Living  Apgars: ,  Weight:   Magnesium Sulfate received: {Mag received:30440022} BMZ received: No Rhophylac:N/A MMR:N/A T-DaP: declined Flu: No Transfusion:{Transfusion received:30440034}  Physical exam  Vitals:   04/07/22 0631 04/07/22 0701 04/07/22 0733 04/07/22 0802  BP: 138/88 128/83 (!) 106/44 (!) 130/108  Pulse: 74 74 69 (!) 103  Resp:      Temp:      TempSrc:      Weight:      Height:       General: {Exam; general:21111117} Lochia: {Desc; appropriate/inappropriate:30686::"appropriate"} Uterine Fundus: {Desc; firm/soft:30687} Incision: {Exam; incision:21111123} DVT  Evaluation: {Exam; dvt:2111122} Labs: Lab Results  Component Value Date   WBC 9.1 04/06/2022   HGB 11.7 (L) 04/06/2022   HCT 34.7 (L) 04/06/2022   MCV 89.9 04/06/2022   PLT 183 04/06/2022      Latest Ref Rng & Units 04/06/2022   11:52 PM  CMP  Glucose 70 - 99 mg/dL 89   BUN 6 - 20 mg/dL <5   Creatinine 0.44 - 1.00 mg/dL 0.55   Sodium 135 - 145 mmol/L 134   Potassium 3.5 - 5.1 mmol/L 3.7   Chloride 98 - 111 mmol/L 104   CO2 22 - 32 mmol/L 21   Calcium 8.9 - 10.3 mg/dL 9.4   Total Protein 6.5 - 8.1 g/dL 6.9   Total Bilirubin 0.3 - 1.2 mg/dL 0.6   Alkaline Phos 38 - 126 U/L 164   AST 15 - 41 U/L 27   ALT 0 - 44 U/L 17    Edinburgh Score:     No data to display           After visit meds:  Allergies as of 04/07/2022   No Known Allergies   Med Rec must be completed prior to using this [facility]***        Discharge home in stable condition Infant Feeding: {Baby feeding:23562} Infant Disposition:{CHL IP OB HOME WITH [id number] Discharge instruction: per After Visit Summary and Postpartum booklet. Activity: Advance as tolerated. Pelvic rest for 6 weeks.  Diet: {OB diet:21111121}  Future Appointments: Future Appointments  Date Time Provider [place]  04/09/2022 10:15 AM Truett Mainland, DO [facility] None   Follow up Visit: Message sent to [facility] on 4/1  Please schedule this patient for a In person postpartum visit in 6 weeks with the following provider: Any provider. Additional Postpartum F/U: TSH LEVEL recheck   High risk pregnancy complicated by:  hypothyroidism Delivery mode:    Anticipated Birth Control:  Unsure   04/07/2022 Shelda Pal, DO

## 2022-04-07 NOTE — Lactation Note (Signed)
This note was copied from a baby's chart. Lactation Consultation Note  Patient Name: Victoria Miller [id number] Date: 04/07/2022 Age:27 years Reason for consult: Initial assessment;Term;Maternal endocrine disorder  Visited with family of 9 hours old FT female; Ms. Colglazier is a P2 and has some experienced breastfeeding. Offered assistance with latch but baby wasn't ready to feed at this time he had + 50 ml of Enfamil 20 calorie formula within the last 3 hours. Asked family to call for latch assistance when needed. Reviewed normal newborn behavior, feeding cues, cluster feeding, size of baby's stomach, supplementation guidelines according to baby's age in hours and anticipatory guidelines.  Maternal Data Has patient been taught Hand Expression?: Yes Does the patient have breastfeeding experience prior to this delivery?: Yes How long did the patient breastfeed?: 3 months  Feeding Mother's Current Feeding Choice: Breast Milk and Formula  Interventions Interventions: Breast feeding basics reviewed;Education;LC Services brochure  Plan Encouraged to put baby to breast 8 times/24 hours or sooner if feeding cues are present Breast massage and hand expression were also encouraged prior latching Parents will continue supplementing with Enfamil 20 calorie formula per feeding choice on admission but will try to follow supplementation guidelines according to baby's age in hours  FOB and paternal grandparents present. All questions and concerns answered, family to contact [facility] services PRN.  Discharge Pump: Personal (Medela DEBP at home)  Consult Status Consult Status: Follow-up Date: 04/08/22 Follow-up type: In-patient   Rafeal Skibicki Francene Boyers 04/07/2022, 4:56 PM

## 2022-04-07 NOTE — MAU Note (Addendum)
Pt requesting IV pain medication for pain.

## 2022-04-07 NOTE — MAU Note (Addendum)
Pt resting quietly - states Fentanyl is effective for pain control

## 2022-04-07 NOTE — MAU Note (Signed)
Pt returned from walking and is asking for IV pain medication- Dr.Pratt on unit-updated on patient-order recieved

## 2022-04-08 ENCOUNTER — Encounter (HOSPITAL_COMMUNITY): Payer: Self-pay | Admitting: Family Medicine

## 2022-04-08 LAB — CBC
HCT: 31 % — ABNORMAL LOW (ref 36.0–46.0)
Hemoglobin: 10.5 g/dL — ABNORMAL LOW (ref 12.0–15.0)
MCH: 30.2 pg (ref 26.0–34.0)
MCHC: 33.9 g/dL (ref 30.0–36.0)
MCV: 89.1 fL (ref 80.0–100.0)
Platelets: 182 10*3/uL (ref 150–400)
RBC: 3.48 MIL/uL — ABNORMAL LOW (ref 3.87–5.11)
RDW: 14 % (ref 11.5–15.5)
WBC: 11 10*3/uL — ABNORMAL HIGH (ref 4.0–10.5)
nRBC: 0 % (ref 0.0–0.2)

## 2022-04-08 MED ORDER — OXYCODONE HCL 5 MG PO TABS
10.0000 mg | ORAL_TABLET | ORAL | Status: DC | PRN
  Administered 2022-04-08 – 2022-04-09 (×4): 10 mg via ORAL
  Filled 2022-04-08 (×4): qty 2

## 2022-04-08 MED ORDER — OXYCODONE HCL 5 MG PO TABS
5.0000 mg | ORAL_TABLET | ORAL | Status: DC | PRN
  Administered 2022-04-09: 5 mg via ORAL
  Filled 2022-04-08: qty 1

## 2022-04-08 MED ORDER — LEVOTHYROXINE SODIUM 75 MCG PO TABS
125.0000 ug | ORAL_TABLET | Freq: Every day | ORAL | Status: DC
  Administered 2022-04-09: 125 ug via ORAL
  Filled 2022-04-08: qty 1

## 2022-04-08 NOTE — Progress Notes (Addendum)
POSTPARTUM PROGRESS NOTE  Post Partum Day 1  Subjective:  Victoria Miller is a 27 y.o. [id number] s/p SVD at [redacted]w[redacted]d.  She reports she is doing well. No acute events overnight. She denies any problems with ambulating, voiding or po intake. Denies nausea or vomiting.  Pain is moderately controlled, she is still having a lot of cramping and doesn't feel that ibuprofen and tylenol is covering it.  Lochia is appropriate, no heavier than a period, no foul smell.  Objective: Blood pressure 116/86, pulse 93, temperature 98.2 F (36.8 C), temperature source Oral, resp. rate 18, height 5\' 11"  (1.803 m), weight 90.7 kg, last menstrual period 07/05/2021, SpO2 100 %, unknown if currently breastfeeding.  Physical Exam:  General: alert, cooperative and no distress Chest: no respiratory distress Heart:regular rate, distal pulses intact Abdomen: soft, nontender,  Uterine Fundus: firm, appropriately tender DVT Evaluation: No calf swelling or tenderness Extremities: mild edema Skin: warm, dry  Recent Labs    04/06/22 2352 04/08/22 0545  HGB 11.7* 10.5*  HCT 34.7* 31.0*    Assessment/Plan: Victoria Miller is a 27 y.o. [id number] s/p SVD at [redacted]w[redacted]d   PPD#1 - Doing well  Routine postpartum care  Added PRN oxycodone for pain control  Contraception: undecided, considering IUD Feeding: breast/bottle Dispo: Plan for discharge tomorrow.   LOS: 1 day   Enid Baas, MD Family Medicine, PGY1 04/08/2022, 7:08 AM

## 2022-04-09 ENCOUNTER — Encounter: Payer: Medicaid Other | Admitting: Family Medicine

## 2022-04-09 MED ORDER — LEVOTHYROXINE SODIUM 125 MCG PO TABS: 125.0000 ug | ORAL_TABLET | Freq: Every day | ORAL | 1 refills | Status: DC

## 2022-04-09 MED ORDER — SENNOSIDES-DOCUSATE SODIUM 8.6-50 MG PO TABS: 2.0000 | ORAL_TABLET | ORAL | 1 refills | Status: DC

## 2022-04-09 MED ORDER — IBUPROFEN 600 MG PO TABS: 600.0000 mg | ORAL_TABLET | Freq: Four times a day (QID) | ORAL | 0 refills | Status: DC

## 2022-04-09 MED ORDER — OXYCODONE HCL 5 MG PO TABS: 5.0000 mg | ORAL_TABLET | Freq: Four times a day (QID) | ORAL | 0 refills | Status: DC | PRN

## 2022-04-09 MED ORDER — ACETAMINOPHEN 325 MG PO TABS: 650.0000 mg | ORAL_TABLET | ORAL | 2 refills | Status: DC | PRN

## 2022-04-09 NOTE — Lactation Note (Signed)
This note was copied from a baby's chart. Lactation Consultation Note  Patient Name: Victoria Miller [id number] Date: 04/09/2022 Age:27 hours Reason for consult: Follow-up assessment;Term Assisted mom in latching. Baby latched well in football hold. BF well. Swallows heard w/milk transfer noted from breast. Breast significantly softer after BF. Baby relaxed. Newborn feeding habits, STS, I&O, positioning, support, and props reviewed. Mom appreciative.  Maternal Data    Feeding Mother's Current Feeding Choice: Breast Milk and Formula  LATCH Score Latch: Grasps breast easily, tongue down, lips flanged, rhythmical sucking.  Audible Swallowing: A few with stimulation  Type of Nipple: Everted at rest and after stimulation (short shaft compressible)  Comfort (Breast/Nipple): Soft / non-tender  Hold (Positioning): Assistance needed to correctly position infant at breast and maintain latch.  LATCH Score: 8   Lactation Tools Discussed/Used    Interventions Interventions: Adjust position;Assisted with latch;Support pillows;Skin to skin;Position options;Breast massage;Breast compression;Breast feeding basics reviewed  Discharge    Consult Status Consult Status: Follow-up Date: 04/09/22 Follow-up type: In-patient    Victoria Miller, [place]ta [place] 04/09/2022, 2:19 AM

## 2022-04-09 NOTE — Lactation Note (Signed)
This note was copied from a baby's chart. Lactation Consultation Note  Patient Name: Victoria Miller [id number] Date: 04/09/2022 Age:27 hours Reason for consult: Follow-up assessment (dyad already packed up for D/C and FOB present. LC reviewd BF D/C teaching and the [place] resources.)   Maternal Data    Feeding Mother's Current Feeding Choice: Breast Milk and Formula Nipple Type: Extra Slow Flow   Interventions Interventions: [place] Services brochure  Discharge Discharge Education: Engorgement and breast care;Warning signs for feeding baby Pump: Personal;DEBP  Consult Status Consult Status: Complete Date: 04/09/22    Myer Haff 04/09/2022, 12:54 PM

## 2022-04-09 NOTE — Discharge Instructions (Addendum)
Cut back your levothyroxine to 116mcg daily, which was your pre-pregnancy dose.   We will plan to recheck your thyroid levels at your post partum visit, to make sure its in normal range, and readjust your medications as needed.  Take ibuprofen and tylenol scheduled 3 times a day, with meals, for the next 2-3 days to keep pain under control. Reserve oxycodone use for worsening pain at the end of the day, or when its not well controlled with ibuprofen and tylenol.

## 2022-04-10 ENCOUNTER — Telehealth: Payer: Self-pay | Admitting: *Deleted

## 2022-04-10 NOTE — Transitions of Care (Post Inpatient/ED Visit) (Signed)
   04/10/2022  Name: Vidette Wilkinson MRN: [id number] DOB: [birth year]  Transitional Care Management Follow-up Telephone Call  Gustava Gholar was discharged from [facility] [place] at [facility]), has or will have a pregnancy risk assessment at next visit, has a scheduled  Post Partum  appointment with the [facility] provider on 05/21/22 date, and has been referred for appropriate case management and ongoing follow up.   Lurena Joiner RN, BSN [place] [facility] RN Care Coordinator [phone]

## 2022-04-17 ENCOUNTER — Telehealth (HOSPITAL_COMMUNITY): Payer: Self-pay | Admitting: *Deleted

## 2022-04-17 ENCOUNTER — Telehealth: Payer: Self-pay

## 2022-04-17 ENCOUNTER — Encounter: Payer: Self-pay | Admitting: Family Medicine

## 2022-04-17 MED ORDER — FLUCONAZOLE 200 MG PO TABS: 200.0000 mg | ORAL_TABLET | Freq: Every day | ORAL | 0 refills | Status: DC

## 2022-04-17 NOTE — Telephone Encounter (Signed)
Patient called and made aware Dr. Adrian Blackwater sent in a script for diflucan mg once daily for seven days. Patient made aware script sent to her pharmacy. Armandina Stammer RN

## 2022-04-17 NOTE — Telephone Encounter (Signed)
Attempted Hospital Discharge Follow-Up Call.  Patient said that she and baby were OK and that they had gone to baby's doctor several times.  When asked if these were routine visits she said "it was just to get treatment for things the doctors there didn't want to treat".  Then the call dropped.  2nd attempt to call went to voice mail and I left a message encouraging patient to call back with any questions or concerns.

## 2022-04-17 NOTE — Telephone Encounter (Signed)
-----   Message from Talbert Cage sent at 04/17/2022  1:41 PM EDT ----- Regarding: Thrush Patient's son has thrush and his pediatrician told her to contact her OB because they believe she has it on her breasts due to breast feeding. Would like a call back.   Phone number is [phone]

## 2022-04-18 ENCOUNTER — Inpatient Hospital Stay (HOSPITAL_COMMUNITY): Payer: Medicaid Other

## 2022-04-18 ENCOUNTER — Inpatient Hospital Stay (HOSPITAL_COMMUNITY): Admission: AD | Admit: 2022-04-18 | Payer: Medicaid Other | Source: Home / Self Care | Admitting: Family Medicine

## 2022-05-19 ENCOUNTER — Other Ambulatory Visit: Payer: Self-pay | Admitting: Family Medicine

## 2022-05-21 ENCOUNTER — Ambulatory Visit (INDEPENDENT_AMBULATORY_CARE_PROVIDER_SITE_OTHER): Payer: Medicaid Other | Admitting: Family Medicine

## 2022-05-21 ENCOUNTER — Encounter: Payer: Self-pay | Admitting: Family Medicine

## 2022-05-21 DIAGNOSIS — E039 Hypothyroidism, unspecified: Secondary | ICD-10-CM

## 2022-05-21 NOTE — Progress Notes (Signed)
Post Partum Visit Note  Victoria Miller is a 27 y.o. [phone] female who presents for a postpartum visit. She is 6 weeks postpartum following a normal spontaneous vaginal delivery.  I have fully reviewed the prenatal and intrapartum course. The delivery was at 39 gestational weeks.  Anesthesia: epidural. Postpartum course has been normal. Baby is doing well. Baby is feeding by both breast and bottle - [employer] . Bleeding no bleeding and brown. Bowel function is normal. Bladder function is normal. Patient is not sexually active. Contraception method is none. Postpartum depression screening: negative.   Upstream - 05/21/22 1057       Pregnancy Intention Screening   Does the patient want to become pregnant in the next year? No    Does the patient's partner want to become pregnant in the next year? No    Would the patient like to discuss contraceptive options today? No      Contraception Wrap Up   Contraception Counseling Provided No    How was the end contraceptive method provided? N/A            The pregnancy intention screening data noted above was reviewed. Potential methods of contraception were discussed. The patient elected to proceed with No data recorded.   Edinburgh Postnatal Depression Scale - 05/21/22 1056       Edinburgh Postnatal Depression Scale:  In the Past 7 Days   I have been able to laugh and see the funny side of things. 0    I have looked forward with enjoyment to things. 0    I have blamed myself unnecessarily when things went wrong. 0    I have been anxious or worried for no good reason. 0    I have felt scared or panicky for no good reason. 0    Things have been getting on top of me. 0    I have been so unhappy that I have had difficulty sleeping. 0    I have felt sad or miserable. 0    I have been so unhappy that I have been crying. 0    The thought of harming myself has occurred to me. 0    Edinburgh Postnatal Depression Scale Total 0              Health Maintenance Due  Topic Date Due   COVID-19 Vaccine (1) Never done   HPV VACCINES (1 - 2-dose series) Never done   DTaP/Tdap/Td (1 - Tdap) Never done    The following portions of the patient's history were reviewed and updated as appropriate: allergies, current medications, past family history, past medical history, past social history, past surgical history, and problem list.  Review of Systems Pertinent items are noted in HPI.  Objective:  BP 129/81   Pulse 63   Wt 190 lb (86.2 kg)   Breastfeeding Yes   BMI 26.50 kg/m    General:  alert, cooperative, and no distress   Breasts:  not indicated  Lungs: clear to auscultation bilaterally  Heart:  regular rate and rhythm, S1, S2 normal, no murmur, click, rub or gallop  Abdomen: soft, non-tender; bowel sounds normal; no masses,  no organomegaly   Wound N/a  GU exam:  not indicated       Assessment:   1. Postpartum exam  2. Acquired hypothyroidism Check TSH - TSH   Plan:   Essential components of care per ACOG recommendations:  1.  Mood and well being: Patient with negative  depression screening today. Reviewed local resources for support.  - Patient tobacco use? No.   - hx of drug use? No.    2. Infant care and feeding:  -Patient currently breastmilk feeding? Yes. Reviewed importance of draining breast regularly to support lactation.  -Social determinants of health (SDOH) reviewed in EPIC. No concerns  3. Sexuality, contraception and birth spacing - Patient does not want a pregnancy in the next year.  - Reviewed reproductive life planning. Reviewed contraceptive methods based on pt preferences and effectiveness.  Patient desired Female Condom today.   - Discussed birth spacing of 18 months  4. Sleep and fatigue -Encouraged family/partner/community support of 4 hrs of uninterrupted sleep to help with mood and fatigue  5. Physical Recovery  - Discussed patients delivery and complications. She describes  her labor as good. - Patient had a Vaginal, no problems at delivery. Patient had a 2nd degree laceration. Perineal healing reviewed. Patient expressed understanding - Patient has urinary incontinence? No. - Patient is not safe to resume physical and sexual activity  6.  Health Maintenance - HM due items addressed No - pt declined PAP today. 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  -Breast Cancer screening indicated? No.   7. Chronic Disease/Pregnancy Condition follow up: Hypothyroidism  - PCP follow up  Levie Heritage, DO [facility], [facility]

## 2022-05-22 LAB — TSH: TSH: 5.59 u[IU]/mL — ABNORMAL HIGH (ref 0.450–4.500)

## 2022-05-22 MED ORDER — LEVOTHYROXINE SODIUM 175 MCG PO TABS: 175.0000 ug | ORAL_TABLET | Freq: Every day | ORAL | 1 refills | Status: AC

## 2022-05-22 NOTE — Addendum Note (Signed)
Addended by: Levie Heritage on: 05/22/2022 08:36 AM   Modules accepted: Orders

## 2022-06-19 ENCOUNTER — Other Ambulatory Visit (HOSPITAL_COMMUNITY)
Admission: RE | Admit: 2022-06-19 | Discharge: 2022-06-19 | Disposition: A | Discharge status: Home / Self Care | Payer: Medicaid Other | Source: Ambulatory Visit | Attending: Family Medicine | Admitting: Family Medicine

## 2022-06-19 ENCOUNTER — Ambulatory Visit (INDEPENDENT_AMBULATORY_CARE_PROVIDER_SITE_OTHER): Payer: Medicaid Other | Admitting: Family Medicine

## 2022-06-19 VITALS — BP 125/78 | HR 93 | Wt 195.0 lb

## 2022-06-19 DIAGNOSIS — E039 Hypothyroidism, unspecified: Secondary | ICD-10-CM | POA: Diagnosis not present

## 2022-06-19 DIAGNOSIS — Z01419 Encounter for gynecological examination (general) (routine) without abnormal findings: Secondary | ICD-10-CM

## 2022-06-19 DIAGNOSIS — R87612 Low grade squamous intraepithelial lesion on cytologic smear of cervix (LGSIL): Secondary | ICD-10-CM

## 2022-06-19 NOTE — Progress Notes (Signed)
ANNUAL EXAM Patient name: Victoria Miller MRN [id number]  Date of birth: December 04, 1995 Chief Complaint:   Annual Exam  History of Present Illness:   Victoria Miller is a 27 y.o.  [phone]  female  being seen today for a routine annual exam.  Current complaints: none. Periods are still regulating from recent delivery  Patient's last menstrual period was 06/18/2022 (exact date).    Last pap 2023. Results were: LSIL w/ HRHPV not done.  Last mammogram: n/a. Results were: normal.      01/02/2022   10:01 AM 09/04/2021    1:39 PM 12/13/2020   10:31 AM 06/26/2020   10:35 AM 01/16/2020    3:00 PM  Depression screen PHQ 2/9  Decreased Interest 0 0 0 3 0  Down, Depressed, Hopeless 0 0 0 0 0  PHQ - 2 Score 0 0 0 3 0  Altered sleeping 0 0 0 0   Tired, decreased energy 0 0 0 3   Change in appetite 0 0 0 1   Feeling bad or failure about yourself  0 0 0 0   Trouble concentrating 0 0 0 0   Moving slowly or fidgety/restless 0 0 0 0   Suicidal thoughts 0 0 0 0   PHQ-9 Score 0 0 0 7         01/02/2022   10:01 AM 09/04/2021    1:39 PM 12/13/2020   10:31 AM 06/26/2020   10:36 AM  GAD 7 : Generalized Anxiety Score  Nervous, Anxious, on Edge 0 0 0 0  Control/stop worrying 0 0 0 0  Worry too much - different things 0 0 0 0  Trouble relaxing 0 0 0 0  Restless 0 0 0 0  Easily annoyed or irritable 0 0 0 3  Afraid - awful might happen 0 0 0 0  Total GAD 7 Score 0 0 0 3     Review of Systems:   Pertinent items are noted in HPI Denies any headaches, blurred vision, fatigue, shortness of breath, chest pain, abdominal pain, abnormal vaginal discharge/itching/odor/irritation, problems with periods, bowel movements, urination, or intercourse unless otherwise stated above. Pertinent History Reviewed:  Reviewed past medical,surgical, social and family history.  Reviewed problem list, medications and allergies. Physical Assessment:   Vitals:   06/19/22 1343  BP: 125/78  Pulse: 93  Weight: 195 lb  (88.5 kg)  Body mass index is 27.2 kg/m.        Physical Examination:   General appearance - well appearing, and in no distress  Mental status - alert, oriented to person, place, and time  Psych:  She has a normal mood and affect  Skin - warm and dry, normal color, no suspicious lesions noted  Chest - effort normal, all lung fields clear to auscultation bilaterally  Heart - normal rate and regular rhythm  Neck:  midline trachea, no thyromegaly or nodules  Breasts -   Abdomen - soft, nontender, nondistended, no masses or organomegaly  Pelvic - VULVA: normal appearing vulva with no masses, tenderness or lesions  VAGINA: normal appearing vagina with normal color and discharge, no lesions  CERVIX: normal appearing cervix without discharge or lesions, no CMT  Thin prep pap is done with HR HPV cotesting  UTERUS: uterus is felt to be normal size, shape, consistency and nontender   ADNEXA: No adnexal masses or tenderness noted.  Extremities:  No swelling or varicosities noted  Chaperone present for exam  Assessment & Plan:  1. Well  woman exam with routine gynecological exam PAP today  2. LGSIL on Pap smear of cervix PAP  3. Acquired hypothyroidism Check TSH in 4 weeks   No orders of the defined types were placed in this encounter.   Meds: No orders of the defined types were placed in this encounter.   Follow-up: No follow-ups on file.  Levie Heritage, DO 06/19/2022 2:13 PM

## 2022-06-19 NOTE — Progress Notes (Signed)
Patient present for pap smear. Armandina Stammer RN

## 2022-06-24 LAB — CYTOLOGY - PAP: Diagnosis: NEGATIVE

## 2022-07-04 ENCOUNTER — Ambulatory Visit: Payer: Medicaid Other

## 2022-07-07 ENCOUNTER — Ambulatory Visit: Payer: Medicaid Other

## 2022-07-17 ENCOUNTER — Ambulatory Visit: Payer: Medicaid Other

## 2022-07-17 DIAGNOSIS — E039 Hypothyroidism, unspecified: Secondary | ICD-10-CM

## 2022-07-17 NOTE — Progress Notes (Signed)
Patient sent to lab for blood draw. Edith Groleau RN 

## 2022-07-18 LAB — TSH: TSH: 25.7 u[IU]/mL — ABNORMAL HIGH (ref 0.450–4.500)

## 2022-07-18 MED ORDER — LEVOTHYROXINE SODIUM 75 MCG PO TABS: 75.0000 ug | ORAL_TABLET | Freq: Every day | ORAL | 3 refills | Status: AC

## 2022-07-18 NOTE — Addendum Note (Signed)
Addended by: Levie Heritage on: 07/18/2022 08:52 AM   Modules accepted: Orders

## 2022-12-14 IMAGING — US US MFM OB LIMITED
1 series · 14 of 28 positions shown · non-contrast
Comparison: none

Indications

 Abnormal finding on antenatal screening
 (T21 per NIPS)
 Missed abortion < 22 weeks
 Hypothyroid (Synthroid)
 Antenatal screening for malformations
 13 weeks gestation of pregnancy
Fetal Evaluation
 Num Of Fetuses:         1
 Preg. Location:         Intrauterine
 Gest. Sac:              Intrauterine
 Fetal Pole:             Visualized
 Cardiac Activity:       Absent
Biometry
 CRL:      71.3  mm     G. Age:  13w 1d                  EDD:   01/31/21
OB History
 Gravidity:    3         Term:   1        Prem:   0        SAB:   0
 TOP:          1       Ectopic:  0        Living: 1
Gestational Age
 LMP:           13w 4d        Date:  04/23/20                 EDD:   01/28/21
 Best:          13w 4d     Det. By:  LMP  (04/23/20)          EDD:   01/28/21
Anatomy
 Cranium:               Appears normal         Upper Extremities:      Appears normal
 Abdominal Wall:        Appears nml (cord      Lower Extremities:      Visualized
                        insert, abd wall)
Cervix Uterus Adnexa
 Cervix
 Closed
 Uterus
 No abnormality visualized.
 Right Ovary
 Within normal limits.
 Left Ovary
 Cul De Sac
 No free fluid seen.
 Adnexa
Impression
 villous sampling.  Yesterday, she had consultation with our
 genetic counselor on the phone.
 On cell free fetal DNA screening, she had increased risk for
 fetal Down syndrome.
 Obstetrical history significant for a term vaginal delivery.  She
 has hypothyroidism and takes Synthroid supplements.
 Patient has sickle-hemoglobin Q-[REDACTED] trait on screening.
 On today's ultrasound, the CRL in measurement is consistent
 with the previously established dates.  Unfortunately, no fetal
 heart activity is seen.  Fetal anatomical survey otherwise
 appears normal.  No evidence of cystic hygroma.

[Series 1: us mfm ob limited · 14 of 40 slices shown]
[im 2/40]
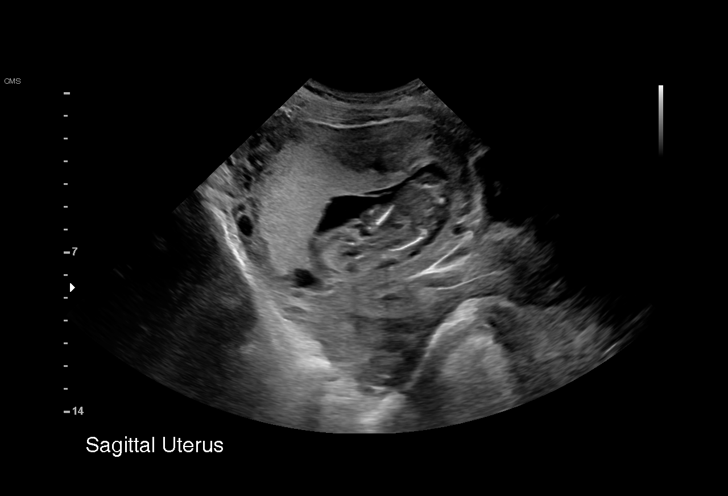
[im 5/40]
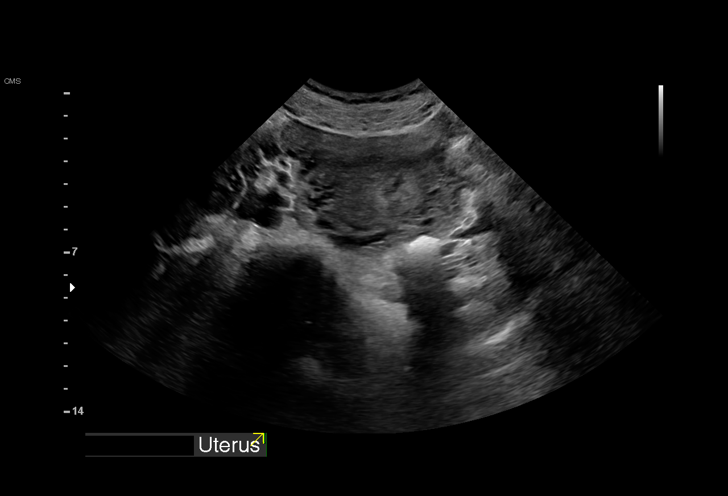
[im 8/40]
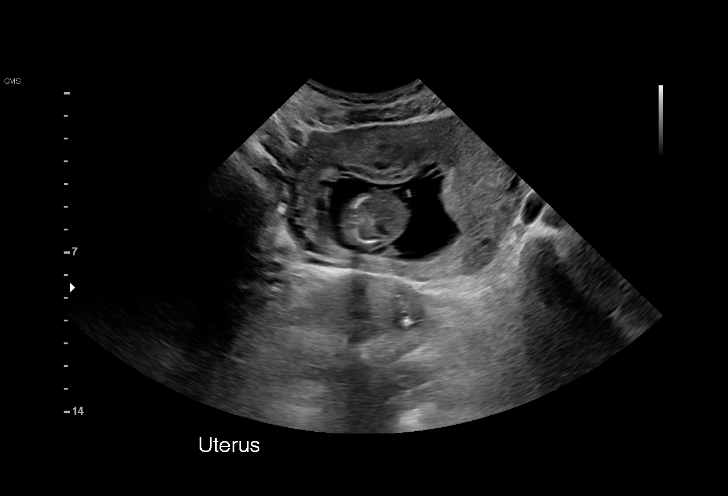
[im 11/40]
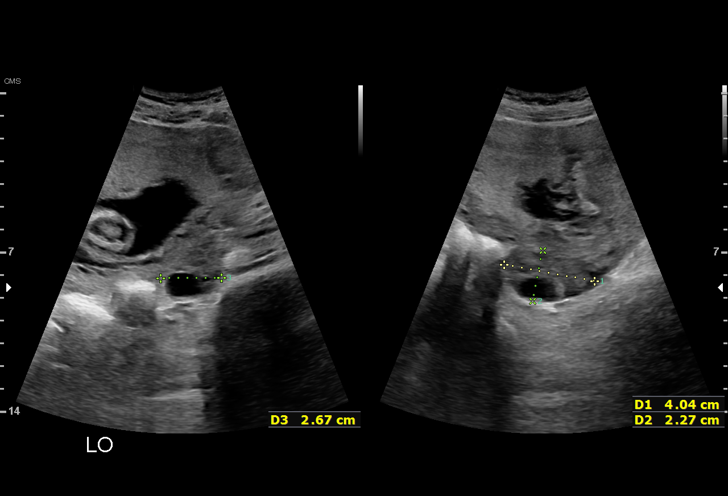
[im 14/40]
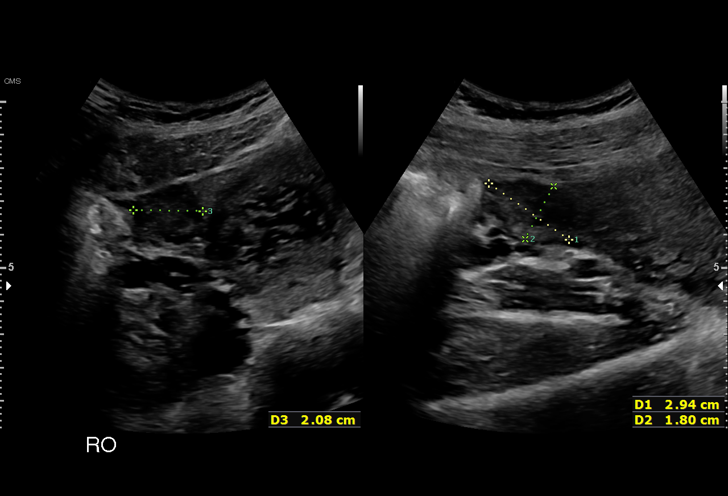
[im 16/40]
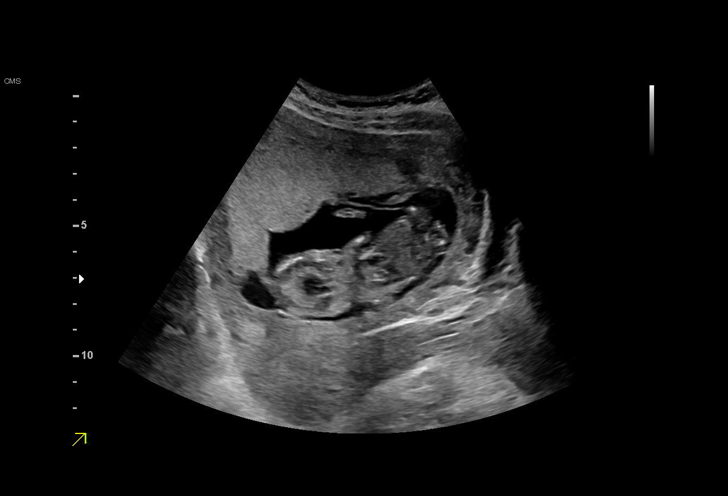
[im 19/40]
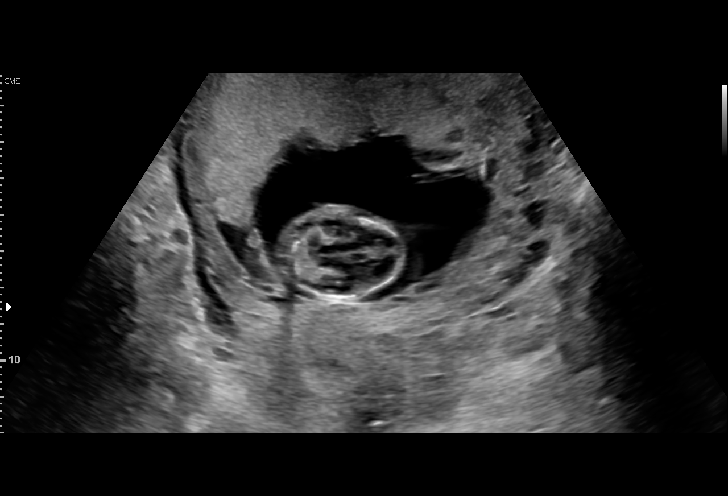
[im 22/40]
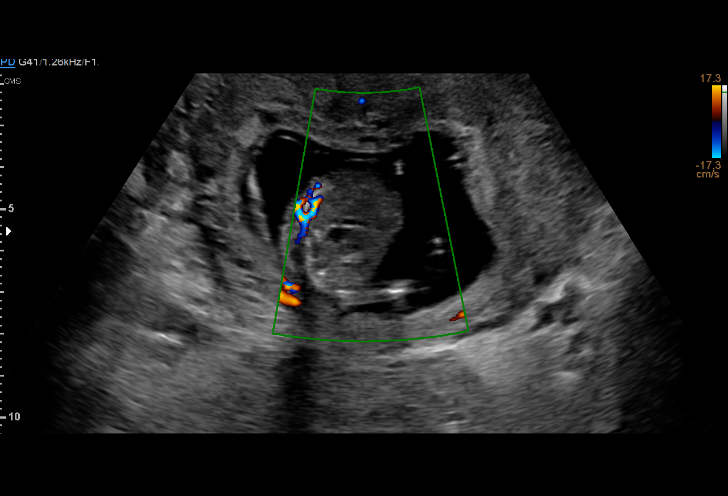
[im 25/40]
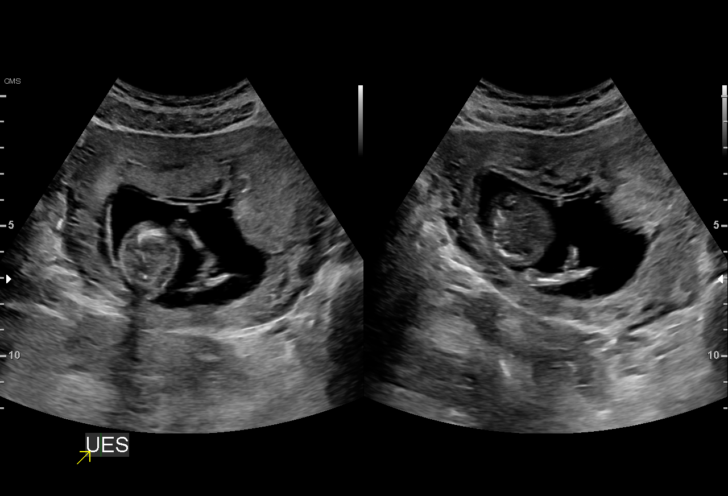
[im 28/40]
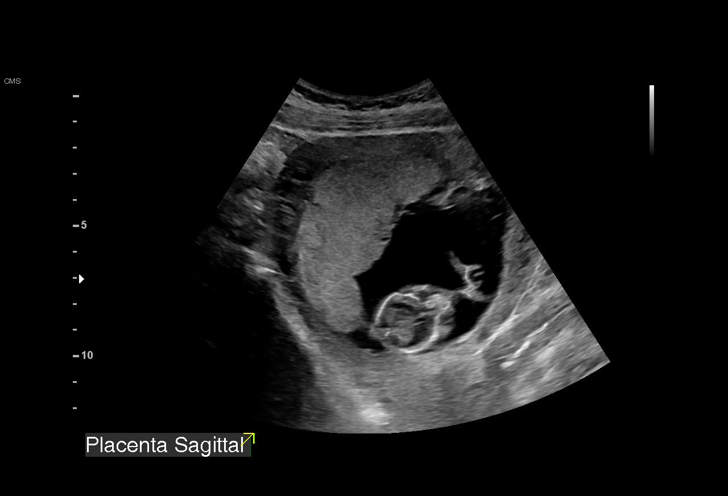
[im 31/40]
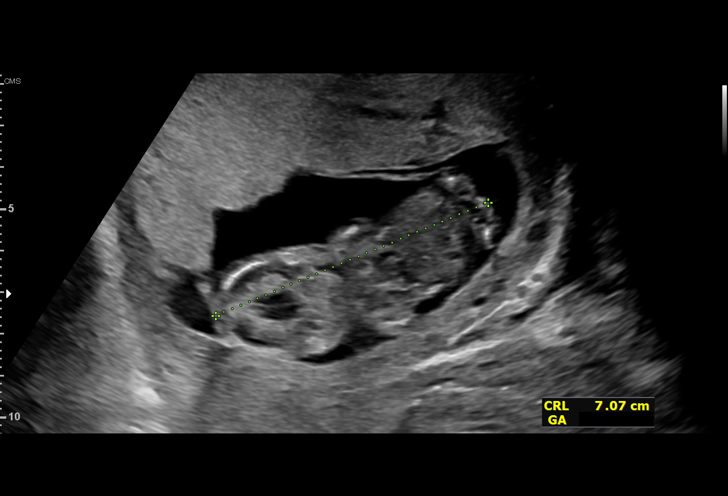
[im 34/40]
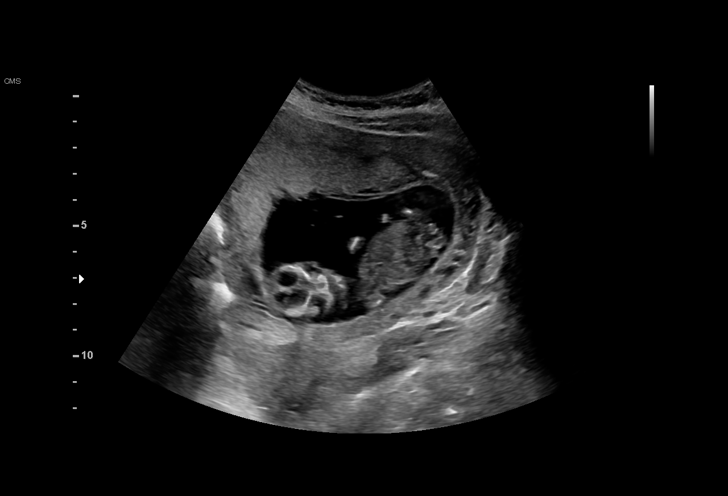
[im 37/40]
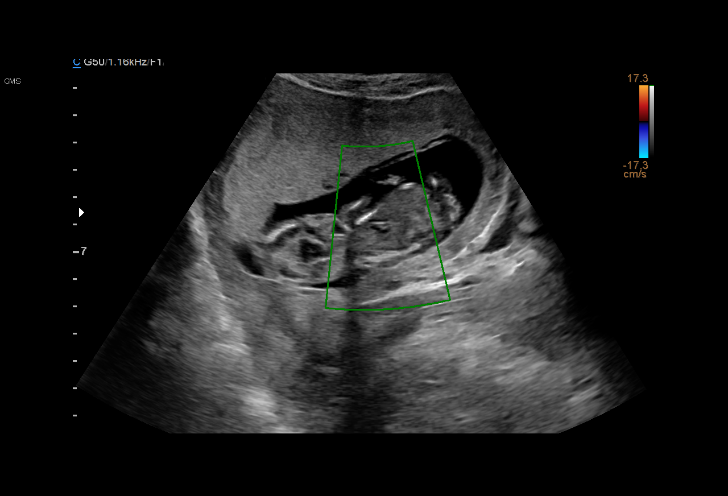
[im 40/40]
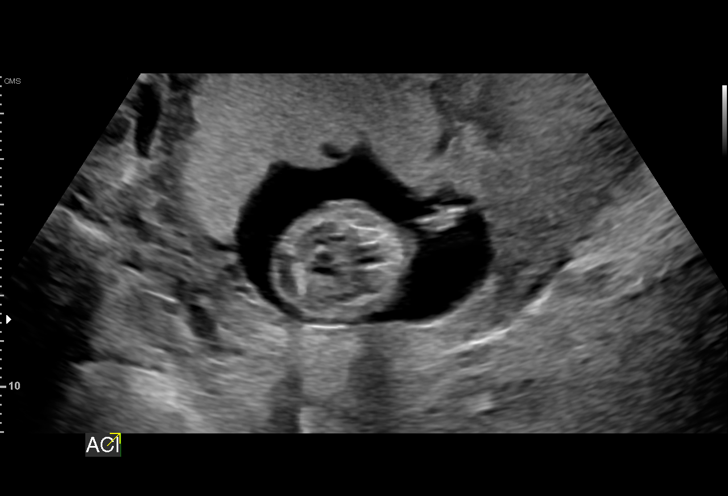

[14 of 28 positions shown; findings below may reference images not displayed]

IMPRESSION: Fetal demise

 I counseled the couple on the findings.  I briefly discussed
 D&C procedure.  I encouraged her to continue prenatal
 vitamins that has folic acid, which reduces the likelihood of
 open neural tube defects in the next pregnancy.
 I will be contacting Dr. Vedrana.
                 Surber, Werner

## 2022-12-24 ENCOUNTER — Other Ambulatory Visit: Payer: Self-pay

## 2022-12-24 ENCOUNTER — Encounter (HOSPITAL_COMMUNITY): Payer: Self-pay

## 2022-12-24 ENCOUNTER — Emergency Department (HOSPITAL_COMMUNITY)
Admission: EM | Admit: 2022-12-24 | Discharge: 2022-12-24 | Discharge status: Against Medical Advice | Payer: Medicaid Other | Attending: Emergency Medicine | Admitting: Emergency Medicine

## 2022-12-24 ENCOUNTER — Emergency Department (HOSPITAL_COMMUNITY)
Admission: EM | Admit: 2022-12-24 | Discharge: 2022-12-24 | Discharge status: Against Medical Advice | Payer: Medicaid Other | Source: Home / Self Care

## 2022-12-24 DIAGNOSIS — R6883 Chills (without fever): Secondary | ICD-10-CM | POA: Insufficient documentation

## 2022-12-24 DIAGNOSIS — R5383 Other fatigue: Secondary | ICD-10-CM | POA: Insufficient documentation

## 2022-12-24 DIAGNOSIS — R109 Unspecified abdominal pain: Secondary | ICD-10-CM | POA: Insufficient documentation

## 2022-12-24 DIAGNOSIS — Z5321 Procedure and treatment not carried out due to patient leaving prior to being seen by health care provider: Secondary | ICD-10-CM | POA: Insufficient documentation

## 2022-12-24 DIAGNOSIS — R197 Diarrhea, unspecified: Secondary | ICD-10-CM | POA: Insufficient documentation

## 2022-12-24 DIAGNOSIS — R112 Nausea with vomiting, unspecified: Secondary | ICD-10-CM | POA: Insufficient documentation

## 2022-12-24 DIAGNOSIS — R519 Headache, unspecified: Secondary | ICD-10-CM | POA: Insufficient documentation

## 2022-12-24 DIAGNOSIS — Z1152 Encounter for screening for COVID-19: Secondary | ICD-10-CM | POA: Insufficient documentation

## 2022-12-24 LAB — COMPREHENSIVE METABOLIC PANEL
ALT: 15 U/L (ref 0–44)
AST: 26 U/L (ref 15–41)
Albumin: 3.8 g/dL (ref 3.5–5.0)
Alkaline Phosphatase: 37 U/L — ABNORMAL LOW (ref 38–126)
Anion gap: 7 (ref 5–15)
BUN: 7 mg/dL (ref 6–20)
CO2: 25 mmol/L (ref 22–32)
Calcium: 9 mg/dL (ref 8.9–10.3)
Chloride: 103 mmol/L (ref 98–111)
Creatinine, Ser: 0.94 mg/dL (ref 0.44–1.00)
GFR, Estimated: >60 mL/min (ref >60)
Glucose, Bld: 100 mg/dL — ABNORMAL HIGH (ref 70–99)
Potassium: 3.9 mmol/L (ref 3.5–5.1)
Sodium: 135 mmol/L (ref 135–145)
Total Bilirubin: 0.5 mg/dL (ref <1.2)
Total Protein: 7.9 g/dL (ref 6.5–8.1)

## 2022-12-24 LAB — CBC
HCT: 39.4 % (ref 36.0–46.0)
Hemoglobin: 13.4 g/dL (ref 12.0–15.0)
MCH: 30 pg (ref 26.0–34.0)
MCHC: 34 g/dL (ref 30.0–36.0)
MCV: 88.3 fL (ref 80.0–100.0)
Platelets: 240 10*3/uL (ref 150–400)
RBC: 4.46 MIL/uL (ref 3.87–5.11)
RDW: 12 % (ref 11.5–15.5)
WBC: 3.8 10*3/uL — ABNORMAL LOW (ref 4.0–10.5)
nRBC: 0 % (ref 0.0–0.2)

## 2022-12-24 LAB — URINALYSIS, ROUTINE W REFLEX MICROSCOPIC
Bacteria, UA: NONE SEEN
Bilirubin Urine: NEGATIVE
Glucose, UA: NEGATIVE mg/dL
Ketones, ur: NEGATIVE mg/dL
Leukocytes,Ua: NEGATIVE
Nitrite: NEGATIVE
Protein, ur: 100 mg/dL — AB
Specific Gravity, Urine: 1.028 (ref 1.005–1.030)
pH: 5 (ref 5.0–8.0)

## 2022-12-24 LAB — HCG, SERUM, QUALITATIVE: Preg, Serum: NEGATIVE

## 2022-12-24 LAB — RESP PANEL BY RT-PCR (RSV, FLU A&B, COVID)  RVPGX2
Influenza A by PCR: NEGATIVE
Influenza B by PCR: NEGATIVE
Resp Syncytial Virus by PCR: NEGATIVE
SARS Coronavirus 2 by RT PCR: NEGATIVE

## 2022-12-24 LAB — LIPASE, BLOOD: Lipase: 33 U/L (ref 11–51)

## 2022-12-24 NOTE — ED Triage Notes (Addendum)
Pt presents with abd pain, N/V/D, and fatigue that started Monday after eating a hot dog. Pt reports that she has noticed flecks of blood when she vomits. Pt was seen at [facility], but did not complete care.   Pt had labs done at [facility], and results are in chart.

## 2022-12-24 NOTE — ED Triage Notes (Signed)
Pt. Stated, On Monday I ate some hot dogs from the refrigerator, I laid down and when I woke up my stomach was hurting and I started having N/V/D . Im feeling like Im dehydrated. I also have a bad headache, with chills.

## 2023-01-03 DIAGNOSIS — R253 Fasciculation: Secondary | ICD-10-CM | POA: Diagnosis not present

## 2023-03-24 DIAGNOSIS — Z13220 Encounter for screening for lipoid disorders: Secondary | ICD-10-CM | POA: Diagnosis not present

## 2023-03-24 DIAGNOSIS — Z0001 Encounter for general adult medical examination with abnormal findings: Secondary | ICD-10-CM | POA: Diagnosis not present

## 2023-03-24 DIAGNOSIS — E039 Hypothyroidism, unspecified: Secondary | ICD-10-CM | POA: Diagnosis not present

## 2023-03-24 DIAGNOSIS — E559 Vitamin D deficiency, unspecified: Secondary | ICD-10-CM | POA: Diagnosis not present

## 2023-06-04 DIAGNOSIS — E039 Hypothyroidism, unspecified: Secondary | ICD-10-CM | POA: Diagnosis not present

## 2023-06-09 DIAGNOSIS — E559 Vitamin D deficiency, unspecified: Secondary | ICD-10-CM | POA: Diagnosis not present

## 2023-06-09 DIAGNOSIS — Z3009 Encounter for other general counseling and advice on contraception: Secondary | ICD-10-CM | POA: Diagnosis not present

## 2023-06-09 DIAGNOSIS — E039 Hypothyroidism, unspecified: Secondary | ICD-10-CM | POA: Diagnosis not present

## 2023-06-24 DIAGNOSIS — Z01419 Encounter for gynecological examination (general) (routine) without abnormal findings: Secondary | ICD-10-CM | POA: Diagnosis not present

## 2023-06-24 DIAGNOSIS — E559 Vitamin D deficiency, unspecified: Secondary | ICD-10-CM | POA: Diagnosis not present

## 2023-06-24 DIAGNOSIS — E039 Hypothyroidism, unspecified: Secondary | ICD-10-CM | POA: Diagnosis not present

## 2023-06-24 DIAGNOSIS — Z0001 Encounter for general adult medical examination with abnormal findings: Secondary | ICD-10-CM | POA: Diagnosis not present

## 2023-06-24 DIAGNOSIS — Z124 Encounter for screening for malignant neoplasm of cervix: Secondary | ICD-10-CM | POA: Diagnosis not present

## 2023-06-26 DIAGNOSIS — Z3009 Encounter for other general counseling and advice on contraception: Secondary | ICD-10-CM | POA: Diagnosis not present

## 2023-06-26 DIAGNOSIS — Z30013 Encounter for initial prescription of injectable contraceptive: Secondary | ICD-10-CM | POA: Diagnosis not present

## 2023-08-10 DIAGNOSIS — E039 Hypothyroidism, unspecified: Secondary | ICD-10-CM | POA: Diagnosis not present

## 2023-09-25 DIAGNOSIS — N944 Primary dysmenorrhea: Secondary | ICD-10-CM | POA: Diagnosis not present

## 2023-09-25 DIAGNOSIS — R42 Dizziness and giddiness: Secondary | ICD-10-CM | POA: Diagnosis not present

## 2023-09-25 DIAGNOSIS — N921 Excessive and frequent menstruation with irregular cycle: Secondary | ICD-10-CM | POA: Diagnosis not present

## 2023-09-25 DIAGNOSIS — E039 Hypothyroidism, unspecified: Secondary | ICD-10-CM | POA: Diagnosis not present

## 2023-10-09 DIAGNOSIS — R300 Dysuria: Secondary | ICD-10-CM | POA: Diagnosis not present

## 2023-10-09 DIAGNOSIS — E039 Hypothyroidism, unspecified: Secondary | ICD-10-CM | POA: Diagnosis not present

## 2023-10-09 DIAGNOSIS — K5900 Constipation, unspecified: Secondary | ICD-10-CM | POA: Diagnosis not present

## 2023-11-05 ENCOUNTER — Encounter (HOSPITAL_BASED_OUTPATIENT_CLINIC_OR_DEPARTMENT_OTHER): Payer: Self-pay

## 2023-11-05 ENCOUNTER — Emergency Department (HOSPITAL_BASED_OUTPATIENT_CLINIC_OR_DEPARTMENT_OTHER)
Admission: EM | Admit: 2023-11-05 | Discharge: 2023-11-05 | Disposition: A | Discharge status: Home / Self Care | Attending: Emergency Medicine | Admitting: Emergency Medicine

## 2023-11-05 ENCOUNTER — Other Ambulatory Visit: Payer: Self-pay

## 2023-11-05 DIAGNOSIS — N898 Other specified noninflammatory disorders of vagina: Secondary | ICD-10-CM | POA: Diagnosis not present

## 2023-11-05 DIAGNOSIS — E039 Hypothyroidism, unspecified: Secondary | ICD-10-CM | POA: Insufficient documentation

## 2023-11-05 LAB — WET PREP, GENITAL
Clue Cells Wet Prep HPF POC: NONE SEEN
Sperm: NONE SEEN
Trich, Wet Prep: NONE SEEN
WBC, Wet Prep HPF POC: >=10 — AB (ref <10)
Yeast Wet Prep HPF POC: NONE SEEN

## 2023-11-05 LAB — URINALYSIS, ROUTINE W REFLEX MICROSCOPIC
Bilirubin Urine: NEGATIVE
Glucose, UA: NEGATIVE mg/dL
Hgb urine dipstick: NEGATIVE
Ketones, ur: NEGATIVE mg/dL
Nitrite: NEGATIVE
Protein, ur: NEGATIVE mg/dL
Specific Gravity, Urine: 1.02 (ref 1.005–1.030)
pH: 7.5 (ref 5.0–8.0)

## 2023-11-05 LAB — URINALYSIS, MICROSCOPIC (REFLEX)

## 2023-11-05 LAB — PREGNANCY, URINE: Preg Test, Ur: NEGATIVE

## 2023-11-05 NOTE — Discharge Instructions (Signed)
 Please go back to your prior soap as this could be causing some irritation.  I would like for you to call your GYN for close follow-up in the next 1 to 2 weeks.  Return to the emergency department if symptoms worsen.

## 2023-11-05 NOTE — ED Triage Notes (Signed)
 Reports vaginal itching, dysuria, abnormal discharge for 4 days.   States recently changed soaps. Also states prone to having BV

## 2023-11-05 NOTE — ED Provider Notes (Signed)
 Emergency Department Provider Note   I have reviewed the triage vital signs and the nursing notes.   HISTORY  Chief Complaint Vaginal Itching   HPI Victoria Miller is a 28 y.o. female presents the emergency department with vaginal itching and discharge.  She recently changed soaps and has been having some discomfort and irritation since.  No concern for STI.  No abdominal or back pain.  No dysuria, hesitancy, urgency.  No fevers or chills.   Past Medical History:  Diagnosis Date   History of blood transfusion    d/t miscarriage   History of ovarian cyst    Hypothyroidism    followed by pcp   Ruptured ovarian cyst 01/08/2018   Sickle cell trait     Review of Systems  Constitutional: No fever/chills Cardiovascular: Denies chest pain. Respiratory: Denies shortness of breath. Gastrointestinal: No abdominal pain.  No nausea, no vomiting.   Genitourinary: Positive vaginal discharge.  Neurological: Negative for headaches.  ____________________________________________   PHYSICAL EXAM:  VITAL SIGNS: ED Triage Vitals  Encounter Vitals Group     BP 11/05/23 0940 103/87     Pulse Rate 11/05/23 0940 82     Resp 11/05/23 0940 17     Temp 11/05/23 0940 98.4 F (36.9 C)     Temp Source 11/05/23 0940 Oral     SpO2 11/05/23 0940 99 %   Constitutional: Alert and oriented. Well appearing and in no acute distress. Eyes: Conjunctivae are normal.  Head: Atraumatic. Nose: No congestion/rhinnorhea. Mouth/Throat: Mucous membranes are moist.   Neck: No stridor.   Cardiovascular: Normal rate, regular rhythm. Good peripheral circulation. Grossly normal heart sounds.   Respiratory: Normal respiratory effort.  No retractions. Lungs CTAB. Gastrointestinal: Soft and nontender. No distention.  Musculoskeletal: No gross deformities of extremities. Neurologic:  Normal speech and language.  Skin:  Skin is warm, dry and intact. No rash  noted.  ____________________________________________   LABS (all labs ordered are listed, but only abnormal results are displayed)  Labs Reviewed  WET PREP, GENITAL - Abnormal; Notable for the following components:      Result Value   WBC, Wet Prep HPF POC >=10 (*)    All other components within normal limits  URINALYSIS, ROUTINE W REFLEX MICROSCOPIC - Abnormal; Notable for the following components:   Leukocytes,Ua TRACE (*)    All other components within normal limits  URINALYSIS, MICROSCOPIC (REFLEX) - Abnormal; Notable for the following components:   Bacteria, UA RARE (*)    All other components within normal limits  PREGNANCY, URINE  GC/CHLAMYDIA PROBE AMP ([place]) NOT AT [facility]    ____________________________________________   PROCEDURES  Procedure(s) performed:   Procedures  None  ____________________________________________   INITIAL IMPRESSION / ASSESSMENT AND PLAN / ED COURSE  Pertinent labs & imaging results that were available during my care of the patient were reviewed by me and considered in my medical decision making (see chart for details).   This patient is Presenting for Evaluation of vaginal discharge, which does require a range of treatment options, and is a complaint that involves a moderate risk of morbidity and mortality.  The Differential Diagnoses include BV, yeast infection, STI, vaginitis, etc.   Clinical Laboratory Tests Ordered, included wet prep without evidence of yeast infection or BV.  Trichomonas negative.  No UTI.  Pregnancy negative.  Medical Decision Making: Summary:  Patient presents the emergency department with vaginal discharge after switching soaps.  No pain symptoms.  Swabs without evidence of trichomonas, yeast,  bacterial vaginosis.  Discussed changing back to old soap.  Patient to follow with her GYN moving forward.  Gonorrhea and chlamydia sent but low suspicion for this overall.  Do not plan to treat empirically with  antibiotics.   Patient's presentation is most consistent with acute, uncomplicated illness.   Disposition: discharge  ____________________________________________  FINAL CLINICAL IMPRESSION(S) / ED DIAGNOSES  Final diagnoses:  Vaginal irritation    Note:  This document was prepared using Dragon voice recognition software and may include unintentional dictation errors.  Fonda Law, MD, [facility]    Kyia Rhude, Fonda MATSU, MD 11/05/23 [phone]

## 2023-11-06 LAB — GC/CHLAMYDIA PROBE AMP (~~LOC~~) NOT AT ARMC
Chlamydia: NEGATIVE
Comment: NEGATIVE
Comment: NORMAL
Neisseria Gonorrhea: NEGATIVE

## 2023-12-19 DIAGNOSIS — E039 Hypothyroidism, unspecified: Secondary | ICD-10-CM | POA: Diagnosis not present
